# Patient Record
Sex: Female | Born: 1960 | Race: White | Hispanic: No | Marital: Married | Smoking: Former smoker
Health system: Southern US, Community
[De-identification: ages and names within clinical notes are randomized; demographics above are authoritative.]

## PROBLEM LIST (undated history)

## (undated) DIAGNOSIS — R519 Headache, unspecified: Secondary | ICD-10-CM

## (undated) DIAGNOSIS — R51 Headache: Secondary | ICD-10-CM

## (undated) DIAGNOSIS — F419 Anxiety disorder, unspecified: Secondary | ICD-10-CM

## (undated) DIAGNOSIS — I10 Essential (primary) hypertension: Secondary | ICD-10-CM

## (undated) DIAGNOSIS — D649 Anemia, unspecified: Secondary | ICD-10-CM

## (undated) DIAGNOSIS — I471 Supraventricular tachycardia, unspecified: Secondary | ICD-10-CM

## (undated) DIAGNOSIS — IMO0002 Reserved for concepts with insufficient information to code with codable children: Secondary | ICD-10-CM

## (undated) DIAGNOSIS — H9319 Tinnitus, unspecified ear: Secondary | ICD-10-CM

## (undated) DIAGNOSIS — J45909 Unspecified asthma, uncomplicated: Secondary | ICD-10-CM

## (undated) DIAGNOSIS — F32A Depression, unspecified: Secondary | ICD-10-CM

## (undated) HISTORY — PX: OTHER SURGICAL HISTORY: SHX169

## (undated) HISTORY — DX: Depression, unspecified: F32.A

## (undated) HISTORY — PX: KNEE SURGERY: SHX244

---

## 1998-07-30 ENCOUNTER — Other Ambulatory Visit: Admission: RE | Admit: 1998-07-30 | Discharge: 1998-07-30 | Payer: Self-pay | Admitting: Obstetrics & Gynecology

## 1998-09-03 ENCOUNTER — Ambulatory Visit (HOSPITAL_COMMUNITY): Admission: RE | Admit: 1998-09-03 | Discharge: 1998-09-03 | Payer: Self-pay | Admitting: Obstetrics & Gynecology

## 2000-03-04 ENCOUNTER — Other Ambulatory Visit: Admission: RE | Admit: 2000-03-04 | Discharge: 2000-03-04 | Payer: Self-pay | Admitting: Obstetrics & Gynecology

## 2000-04-22 ENCOUNTER — Emergency Department (HOSPITAL_COMMUNITY): Admission: EM | Admit: 2000-04-22 | Discharge: 2000-04-22 | Payer: Self-pay | Admitting: Emergency Medicine

## 2001-06-13 ENCOUNTER — Other Ambulatory Visit: Admission: RE | Admit: 2001-06-13 | Discharge: 2001-06-13 | Payer: Self-pay | Admitting: Obstetrics & Gynecology

## 2002-11-06 ENCOUNTER — Other Ambulatory Visit: Admission: RE | Admit: 2002-11-06 | Discharge: 2002-11-06 | Payer: Self-pay | Admitting: Obstetrics & Gynecology

## 2004-01-10 ENCOUNTER — Other Ambulatory Visit: Admission: RE | Admit: 2004-01-10 | Discharge: 2004-01-10 | Payer: Self-pay | Admitting: Obstetrics & Gynecology

## 2005-03-26 ENCOUNTER — Other Ambulatory Visit: Admission: RE | Admit: 2005-03-26 | Discharge: 2005-03-26 | Payer: Self-pay | Admitting: Obstetrics & Gynecology

## 2010-08-13 ENCOUNTER — Encounter: Admission: RE | Admit: 2010-08-13 | Discharge: 2010-08-13 | Payer: Self-pay | Admitting: Geriatric Medicine

## 2011-01-15 ENCOUNTER — Other Ambulatory Visit: Payer: Self-pay | Admitting: Obstetrics & Gynecology

## 2011-01-15 DIAGNOSIS — R928 Other abnormal and inconclusive findings on diagnostic imaging of breast: Secondary | ICD-10-CM

## 2011-01-21 ENCOUNTER — Ambulatory Visit
Admission: RE | Admit: 2011-01-21 | Discharge: 2011-01-21 | Disposition: A | Discharge status: Home / Self Care | Payer: PRIVATE HEALTH INSURANCE | Source: Ambulatory Visit | Attending: Obstetrics & Gynecology | Admitting: Obstetrics & Gynecology

## 2011-01-21 DIAGNOSIS — R928 Other abnormal and inconclusive findings on diagnostic imaging of breast: Secondary | ICD-10-CM

## 2011-11-14 ENCOUNTER — Ambulatory Visit (INDEPENDENT_AMBULATORY_CARE_PROVIDER_SITE_OTHER): Payer: PRIVATE HEALTH INSURANCE

## 2011-11-14 DIAGNOSIS — J111 Influenza due to unidentified influenza virus with other respiratory manifestations: Secondary | ICD-10-CM

## 2012-03-06 ENCOUNTER — Emergency Department (HOSPITAL_COMMUNITY): Payer: PRIVATE HEALTH INSURANCE

## 2012-03-06 ENCOUNTER — Encounter (HOSPITAL_COMMUNITY): Payer: Self-pay | Admitting: *Deleted

## 2012-03-06 ENCOUNTER — Emergency Department (HOSPITAL_COMMUNITY)
Admission: EM | Admit: 2012-03-06 | Discharge: 2012-03-06 | Disposition: A | Discharge status: Home / Self Care | Payer: PRIVATE HEALTH INSURANCE | Attending: Emergency Medicine | Admitting: Emergency Medicine

## 2012-03-06 DIAGNOSIS — R42 Dizziness and giddiness: Secondary | ICD-10-CM | POA: Insufficient documentation

## 2012-03-06 DIAGNOSIS — F411 Generalized anxiety disorder: Secondary | ICD-10-CM | POA: Insufficient documentation

## 2012-03-06 DIAGNOSIS — R002 Palpitations: Secondary | ICD-10-CM | POA: Insufficient documentation

## 2012-03-06 DIAGNOSIS — IMO0002 Reserved for concepts with insufficient information to code with codable children: Secondary | ICD-10-CM | POA: Insufficient documentation

## 2012-03-06 HISTORY — DX: Supraventricular tachycardia: I47.1

## 2012-03-06 HISTORY — DX: Supraventricular tachycardia, unspecified: I47.10

## 2012-03-06 HISTORY — DX: Reserved for concepts with insufficient information to code with codable children: IMO0002

## 2012-03-06 HISTORY — DX: Anxiety disorder, unspecified: F41.9

## 2012-03-06 LAB — COMPREHENSIVE METABOLIC PANEL
ALT: 21 U/L (ref 0–35)
Alkaline Phosphatase: 78 U/L (ref 39–117)
CO2: 30 mEq/L (ref 19–32)
Calcium: 10.5 mg/dL (ref 8.4–10.5)
GFR calc Af Amer: 83 mL/min — ABNORMAL LOW (ref >90)
GFR calc non Af Amer: 71 mL/min — ABNORMAL LOW (ref >90)
Glucose, Bld: 99 mg/dL (ref 70–99)
Sodium: 138 mEq/L (ref 135–145)
Total Bilirubin: 0.3 mg/dL (ref 0.3–1.2)

## 2012-03-06 LAB — DIFFERENTIAL
Eosinophils Relative: 2 % (ref 0–5)
Lymphocytes Relative: 26 % (ref 12–46)
Lymphs Abs: 1.9 10*3/uL (ref 0.7–4.0)

## 2012-03-06 LAB — POCT I-STAT, CHEM 8
BUN: 14 mg/dL (ref 6–23)
Calcium, Ion: 1.35 mmol/L — ABNORMAL HIGH (ref 1.12–1.32)
Chloride: 104 mEq/L (ref 96–112)
Glucose, Bld: 97 mg/dL (ref 70–99)

## 2012-03-06 LAB — CBC
HCT: 43.4 % (ref 36.0–46.0)
MCV: 83 fL (ref 78.0–100.0)
Platelets: 237 10*3/uL (ref 150–400)
RBC: 5.23 MIL/uL — ABNORMAL HIGH (ref 3.87–5.11)
WBC: 7.2 10*3/uL (ref 4.0–10.5)

## 2012-03-06 LAB — URINALYSIS, ROUTINE W REFLEX MICROSCOPIC
Bilirubin Urine: NEGATIVE
Ketones, ur: NEGATIVE mg/dL
Nitrite: NEGATIVE
Protein, ur: NEGATIVE mg/dL
Urobilinogen, UA: 0.2 mg/dL (ref 0.0–1.0)

## 2012-03-06 MED ORDER — LORAZEPAM 1 MG PO TABS
1.0000 mg | ORAL_TABLET | Freq: Once | ORAL | Status: AC
  Administered 2012-03-06: 1 mg via ORAL
  Filled 2012-03-06: qty 1

## 2012-03-06 NOTE — ED Notes (Signed)
[id number]> Expected date:<BR> Expected time: 5:05 PM<BR> Means of arrival:Ambulance<BR> Comments:<BR> M41 -- Anxiety

## 2012-03-06 NOTE — ED Notes (Signed)
Per EMS. Pt is from work and started to feel dizzy and anxious. Pt has history of anxiety. Pt reports recent stress with mother's death last week and husband recently in hospital. Pt did not take anxiety medication today. Pt report this anxiety attack is different from others as this one came on more suddenly and was more severe. Pt denies chest pain or pressure, but does report palpitations. EMS did 12-Lead with NS and occ PAC. Pt also has 20G IV to left AC.  Pt reports anxiety has improved en route

## 2012-03-06 NOTE — ED Provider Notes (Signed)
History     CSN: [id number]  Arrival date & time 03/06/12  1715   First MD Initiated Contact with Patient 03/06/12 1955      Chief Complaint  Patient presents with  . Anxiety  . Palpitations  . Dizziness    (Consider location/radiation/quality/duration/timing/severity/associated sxs/prior treatment) HPI  Patient at work at desk and have palpitations and dizzy and felt like she was going to pass out.  No pain.  Patient had headache after arrival but resolved after 30 minutes.  Patient with history of some palpitations and wore monitor in past.  Patient with recent death of mother and takes xanax prn but not regularly.    Past Medical History  Diagnosis Date  . Anxiety   . SVT (supraventricular tachycardia)   . Degenerative disc disease     Past Surgical History  Procedure Date  . Cesarean section   . Knee surgery     Family History  Problem Relation Age of Onset  . Heart failure Mother   . Diabetes Mother   . Hypertension Mother   . Diabetes Father     History  Substance Use Topics  . Smoking status: Former Games developer  . Smokeless tobacco: Never Used  . Alcohol Use: Yes     occasionally    OB History    Grav Para Term Preterm Abortions TAB SAB Ect Mult Living                  Review of Systems  All other systems reviewed and are negative.    Allergies  Erythromycin  Home Medications   Current Outpatient Rx  Name Route Sig Dispense Refill  . ALPRAZOLAM 0.25 MG PO TABS Oral Take 0.25 mg by mouth at bedtime as needed.    . ATENOLOL 25 MG PO TABS Oral Take 25 mg by mouth daily.    . OMEGA-3 FATTY ACIDS 1000 MG PO CAPS Oral Take 1 g by mouth daily.    Malachy Mood FOR HER 50+ PO Oral Take 1 tablet by mouth at bedtime.    . MELOXICAM 15 MG PO TABS Oral Take 7.5 mg by mouth 2 (two) times daily. Takes 1/2 tablet twice daily      BP 111/56  Pulse 76  Resp 13  SpO2 100%  Physical Exam  Nursing note and vitals reviewed. Constitutional: She appears  well-developed and well-nourished.  HENT:  Head: Normocephalic and atraumatic.  Eyes: Conjunctivae and EOM are normal. Pupils are equal, round, and reactive to light.  Neck: Normal range of motion. Neck supple.  Cardiovascular: Normal rate, regular rhythm, normal heart sounds and intact distal pulses.   Pulmonary/Chest: Effort normal and breath sounds normal.  Abdominal: Soft. Bowel sounds are normal.  Musculoskeletal: Normal range of motion.  Neurological: She is alert.  Skin: Skin is warm and dry.  Psychiatric: She has a normal mood and affect. Thought content normal.    ED Course  Procedures (including critical care time)  Labs Reviewed  CBC - Abnormal; Notable for the following:    RBC 5.23 (*)    All other components within normal limits  COMPREHENSIVE METABOLIC PANEL - Abnormal; Notable for the following:    GFR calc non Af Amer 71 (*)    GFR calc Af Amer 83 (*)    All other components within normal limits  DIFFERENTIAL  POCT I-STAT TROPONIN I  URINALYSIS, ROUTINE W REFLEX MICROSCOPIC   No results found.   No diagnosis found.   Dg Chest  2 View  03/06/2012  *RADIOLOGY REPORT*  Clinical Data: Anxiety, dizziness and heart palpitations.  CHEST - 2 VIEW  Comparison: No priors.  Findings: Lung volumes are normal.  No consolidative airspace disease.  No pleural effusions.  No pneumothorax.  No pulmonary nodule or mass noted.  Pulmonary vasculature and the cardiomediastinal silhouette are within normal limits.  IMPRESSION: 1. No radiographic evidence of acute cardiopulmonary disease.  Original Report Authenticated By: Florencia Reasons, M.D.   Date: 03/06/2012  Rate: 82  Rhythm: normal sinus rhythm  QRS Axis: normal  Intervals: normal  ST/T Wave abnormalities: normal  Conduction Disutrbances: none  Narrative Interpretation: unremarkable     MDM    Patient with normal hydration here with EKG and troponin. She describes an episode of palpitations which has since  resolved. He is advised to followup with her primary care Dr. for possible repeat cardiac monitoring in the ambulatory setting. She is continuing to take atenolol which he has for several years due to an episode of palpitations and presumed tachycardia in the past. She's continued to take these.      Hilario Quarry, MD 03/06/12 [phone]

## 2012-03-06 NOTE — Discharge Instructions (Signed)
Palpitations  A palpitation is the feeling that your heartbeat is irregular or is faster than normal. Although this is frightening, it usually is not serious. Palpitations may be caused by excesses of smoking, caffeine, or alcohol. They are also brought on by stress and anxiety. Sometimes, they are caused by heart disease. Unless otherwise noted, your caregiver did not find any signs of serious illness at this time. HOME CARE INSTRUCTIONS  To help prevent palpitations:  Drink decaffeinated coffee, tea, and soda pop. Avoid chocolate.   If you smoke or drink alcohol, quit or cut down as much as possible.   Reduce your stress or anxiety level. Biofeedback, yoga, or meditation will help you relax. Physical activity such as swimming, jogging, or walking also may be helpful.  SEEK MEDICAL CARE IF:   You continue to have a fast heartbeat.   Your palpitations occur more often.  SEEK IMMEDIATE MEDICAL CARE IF: You develop chest pain, shortness of breath, severe headache, dizziness, or fainting. Document Released: 10/29/2000 Document Revised: 10/21/2011 Document Reviewed: 12/29/2007 ExitCare Patient Information 2012 [employer]. 

## 2012-03-06 NOTE — ED Notes (Signed)
Patient reports that she was at work and a sudden onset of "feeling funny" and feeling like she was going to pass out. Patient reports that she has had anxiety before, but "not like this." patient states she had palpitations at the beginning, but not now.

## 2014-11-15 HISTORY — PX: WISDOM TOOTH EXTRACTION: SHX21

## 2015-04-07 ENCOUNTER — Other Ambulatory Visit: Payer: Self-pay | Admitting: Orthopedic Surgery

## 2015-04-07 DIAGNOSIS — M47816 Spondylosis without myelopathy or radiculopathy, lumbar region: Secondary | ICD-10-CM

## 2015-04-09 ENCOUNTER — Ambulatory Visit
Admission: RE | Admit: 2015-04-09 | Discharge: 2015-04-09 | Disposition: A | Discharge status: Home / Self Care | Payer: Self-pay | Source: Ambulatory Visit | Attending: Orthopedic Surgery | Admitting: Orthopedic Surgery

## 2015-04-09 ENCOUNTER — Other Ambulatory Visit: Payer: Self-pay | Admitting: Orthopedic Surgery

## 2015-04-09 ENCOUNTER — Ambulatory Visit
Admission: RE | Admit: 2015-04-09 | Discharge: 2015-04-09 | Disposition: A | Discharge status: Home / Self Care | Payer: PRIVATE HEALTH INSURANCE | Source: Ambulatory Visit | Attending: Orthopedic Surgery | Admitting: Orthopedic Surgery

## 2015-04-09 DIAGNOSIS — M47816 Spondylosis without myelopathy or radiculopathy, lumbar region: Secondary | ICD-10-CM

## 2015-04-09 DIAGNOSIS — R52 Pain, unspecified: Secondary | ICD-10-CM

## 2015-04-09 MED ORDER — IOHEXOL 180 MG/ML  SOLN
15.0000 mL | Freq: Once | INTRAMUSCULAR | Status: AC | PRN
  Administered 2015-04-09: 15 mL via INTRATHECAL

## 2015-04-09 MED ORDER — MEPERIDINE HCL 100 MG/ML IJ SOLN
75.0000 mg | Freq: Once | INTRAMUSCULAR | Status: AC
  Administered 2015-04-09: 75 mg via INTRAMUSCULAR

## 2015-04-09 MED ORDER — DIAZEPAM 5 MG PO TABS
5.0000 mg | ORAL_TABLET | Freq: Once | ORAL | Status: AC
  Administered 2015-04-09: 5 mg via ORAL

## 2015-04-09 MED ORDER — ONDANSETRON HCL 4 MG/2ML IJ SOLN
4.0000 mg | Freq: Once | INTRAMUSCULAR | Status: AC
  Administered 2015-04-09: 4 mg via INTRAMUSCULAR

## 2015-04-09 NOTE — Discharge Instructions (Addendum)
Myelogram Discharge Instructions  1. Go home and rest quietly for the next 24 hours.  It is important to lie flat for the next 24 hours.  Get up only to go to the restroom.  You may lie in the bed or on a couch on your back, your stomach, your left side or your right side.  You may have one pillow under your head.  You may have pillows between your knees while you are on your side or under your knees while you are on your back.  2. DO NOT drive today.  Recline the seat as far back as it will go, while still wearing your seat belt, on the way home.  3. You may get up to go to the bathroom as needed.  You may sit up for 10 minutes to eat.  You may resume your normal diet and medications unless otherwise indicated.  Drink lots of extra fluids today and tomorrow.  4. The incidence of headache, nausea, or vomiting is about 5% (one in 20 patients).  If you develop a headache, lie flat and drink plenty of fluids until the headache goes away.  Caffeinated beverages may be helpful.  If you develop severe nausea and vomiting or a headache that does not go away with flat bed rest, call [phone].  5. You may resume normal activities after your 24 hours of bed rest is over; however, do not exert yourself strongly or do any heavy lifting tomorrow. If when you get up you have a headache when standing, go back to bed and force fluids for another 24 hours.  6. Call your physician for a follow-up appointment.  The results of your myelogram will be sent directly to your physician by the following day.  7. If you have any questions or if complications develop after you arrive home, please call [phone].  Discharge instructions have been explained to the patient.  The patient, or the person responsible for the patient, fully understands these instructions.       May resume Sertraline on Apr 10, 2015, after 9:30 am.

## 2015-04-09 NOTE — Progress Notes (Signed)
Pt states she has been off Sertraline for the past 2 days. Discharge instructions explained to pt. 

## 2015-04-10 ENCOUNTER — Other Ambulatory Visit: Payer: Self-pay | Admitting: Orthopedic Surgery

## 2015-04-11 ENCOUNTER — Other Ambulatory Visit (HOSPITAL_COMMUNITY): Payer: Self-pay | Admitting: Orthopedic Surgery

## 2015-04-11 ENCOUNTER — Other Ambulatory Visit (HOSPITAL_COMMUNITY): Payer: Self-pay | Admitting: Anesthesiology

## 2015-04-11 NOTE — Patient Instructions (Addendum)
Laurie PlowmanBrenda A Mcclain  04/11/2015   Your procedure is scheduled on:  Wednesday 04/16/2015  Report [facility] Main  Entrance and follow signs to               Short Stay Center at 135 PM.  Call this number if you have problems the morning of surgery [phone]   Remember: ONLY 1 PERSON MAY GO WITH YOU TO SHORT STAY TO GET  READY MORNING OF YOUR SURGERY.   Do not eat food  :After Midnight. MAY HAVE CLEAR LIQUIDS FROM MIDNIGHT UNTIL 0935 AM THEN NOTHING UNTIL AFTER SURGERY!     Take these medicines the morning of surgery with A SIP OF WATER:  Alegra, Systane Eye Drops, Hydrocodone if needed                               You may not have any metal on your body including hair pins and              piercings  Do not wear jewelry, make-up, lotions, powders or perfumes, deodorant             Do not wear nail polish.  Do not shave  48 hours prior to surgery.           .   Do not bring valuables to the hospital. [facility] IS NOT             RESPONSIBLE   FOR VALUABLES.  Contacts, dentures or bridgework may not be worn into surgery.  Leave suitcase in the car. After surgery it may be brought to your room.      Special Instructions: coughing and deep breathing exercises, leg exercises              Please read over the following fact sheets you were given: _____________________________________________________________________             [facility] - Preparing for Surgery Before surgery, you can play an important role.  Because skin is not sterile, your skin needs to be as free of germs as possible.  You can reduce the number of germs on your skin by washing with CHG (chlorahexidine gluconate) soap before surgery.  CHG is an antiseptic cleaner which kills germs and bonds with the skin to continue killing germs even after washing. Please DO NOT use if you have an allergy to CHG or antibacterial soaps.  If your skin becomes reddened/irritated stop using the CHG and inform  your nurse when you arrive at Short Stay. Do not shave (including legs and underarms) for at least 48 hours prior to the first CHG shower.  You may shave your face/neck. Please follow these instructions carefully:  1.  Shower with CHG Soap the night before surgery and the  morning of Surgery.  2.  If you choose to wash your hair, wash your hair first as usual with your  normal  shampoo.  3.  After you shampoo, rinse your hair and body thoroughly to remove the  shampoo.                           4.  Use CHG as you would any other liquid soap.  You can apply chg directly  to the skin and wash  Gently with a scrungie or clean washcloth.  5.  Apply the CHG Soap to your body ONLY FROM THE NECK DOWN.   Do not use on face/ open                           Wound or open sores. Avoid contact with eyes, ears mouth and genitals (private parts).                       Wash face,  Genitals (private parts) with your normal soap.             6.  Wash thoroughly, paying special attention to the area where your surgery  will be performed.  7.  Thoroughly rinse your body with warm water from the neck down.  8.  DO NOT shower/wash with your normal soap after using and rinsing off  the CHG Soap.                9.  Pat yourself dry with a clean towel.            10.  Wear clean pajamas.            11.  Place clean sheets on your bed the night of your first shower and do not  sleep with pets. Day of Surgery : Do not apply any lotions/deodorants the morning of surgery.  Please wear clean clothes to the hospital/surgery center.  FAILURE TO FOLLOW THESE INSTRUCTIONS MAY RESULT IN THE CANCELLATION OF YOUR SURGERY PATIENT SIGNATURE_________________________________  NURSE SIGNATURE__________________________________  ________________________________________________________________________   Adam Phenix  An incentive spirometer is a tool that can help keep your lungs clear and active. This  tool measures how well you are filling your lungs with each breath. Taking long deep breaths may help reverse or decrease the chance of developing breathing (pulmonary) problems (especially infection) following:  A long period of time when you are unable to move or be active. BEFORE THE PROCEDURE   If the spirometer includes an indicator to show your best effort, your nurse or respiratory therapist will set it to a desired goal.  If possible, sit up straight or lean slightly forward. Try not to slouch.  Hold the incentive spirometer in an upright position. INSTRUCTIONS FOR USE  1. Sit on the edge of your bed if possible, or sit up as far as you can in bed or on a chair. 2. Hold the incentive spirometer in an upright position. 3. Breathe out normally. 4. Place the mouthpiece in your mouth and seal your lips tightly around it. 5. Breathe in slowly and as deeply as possible, raising the piston or the ball toward the top of the column. 6. Hold your breath for 3-5 seconds or for as long as possible. Allow the piston or ball to fall to the bottom of the column. 7. Remove the mouthpiece from your mouth and breathe out normally. 8. Rest for a few seconds and repeat Steps 1 through 7 at least 10 times every 1-2 hours when you are awake. Take your time and take a few normal breaths between deep breaths. 9. The spirometer may include an indicator to show your best effort. Use the indicator as a goal to work toward during each repetition. 10. After each set of 10 deep breaths, practice coughing to be sure your lungs are clear. If you have an incision (the cut made at the time of surgery),  support your incision when coughing by placing a pillow or rolled up towels firmly against it. Once you are able to get out of bed, walk around indoors and cough well. You may stop using the incentive spirometer when instructed by your caregiver.  RISKS AND COMPLICATIONS  Take your time so you do not get dizzy or  light-headed.  If you are in pain, you may need to take or ask for pain medication before doing incentive spirometry. It is harder to take a deep breath if you are having pain. AFTER USE  Rest and breathe slowly and easily.  It can be helpful to keep track of a log of your progress. Your caregiver can provide you with a simple table to help with this. If you are using the spirometer at home, follow these instructions: [place] IF:   You are having difficultly using the spirometer.  You have trouble using the spirometer as often as instructed.  Your pain medication is not giving enough relief while using the spirometer.  You develop fever of 100.5 F (38.1 C) or higher. SEEK IMMEDIATE MEDICAL CARE IF:   You cough up bloody sputum that had not been present before.  You develop fever of 102 F (38.9 C) or greater.  You develop worsening pain at or near the incision site. MAKE SURE YOU:   Understand these instructions.  Will watch your condition.  Will get help right away if you are not doing well or get worse. Document Released: 03/14/2007 Document Revised: 01/24/2012 Document Reviewed: 05/15/2007 ExitCare Patient Information 2014 [place].   ________________________________________________________________________    CLEAR LIQUID DIET   Foods Allowed                                                                     Foods Excluded  Coffee and tea, regular and decaf                             liquids that you cannot  Plain Jell-O in any flavor                                             see through such as: Fruit ices (not with fruit pulp)                                     milk, soups, orange juice  Iced Popsicles                                    All solid food Carbonated beverages, regular and diet                                    Cranberry, grape and apple juices Sports drinks like Gatorade Lightly seasoned clear broth or consume(fat  free) Sugar, honey syrup  Sample Menu Breakfast  Lunch                                     Supper Cranberry juice                    Beef broth                            Chicken broth Jell-O                                     Grape juice                           Apple juice Coffee or tea                        Jell-O                                      Popsicle                                                Coffee or tea                        Coffee or tea  _____________________________________________________________________

## 2015-04-15 ENCOUNTER — Encounter (HOSPITAL_COMMUNITY): Payer: Self-pay

## 2015-04-15 ENCOUNTER — Ambulatory Visit (HOSPITAL_COMMUNITY)
Admission: RE | Admit: 2015-04-15 | Discharge: 2015-04-15 | Disposition: A | Discharge status: Home / Self Care | Payer: PRIVATE HEALTH INSURANCE | Source: Ambulatory Visit | Attending: Orthopedic Surgery | Admitting: Orthopedic Surgery

## 2015-04-15 ENCOUNTER — Encounter (HOSPITAL_COMMUNITY)
Admission: RE | Admit: 2015-04-15 | Discharge: 2015-04-15 | Disposition: A | Discharge status: Home / Self Care | Payer: PRIVATE HEALTH INSURANCE | Source: Ambulatory Visit | Attending: Orthopedic Surgery | Admitting: Orthopedic Surgery

## 2015-04-15 DIAGNOSIS — Z01818 Encounter for other preprocedural examination: Secondary | ICD-10-CM | POA: Insufficient documentation

## 2015-04-15 DIAGNOSIS — Z01810 Encounter for preprocedural cardiovascular examination: Secondary | ICD-10-CM | POA: Insufficient documentation

## 2015-04-15 DIAGNOSIS — M5126 Other intervertebral disc displacement, lumbar region: Secondary | ICD-10-CM | POA: Insufficient documentation

## 2015-04-15 DIAGNOSIS — M48062 Spinal stenosis, lumbar region with neurogenic claudication: Secondary | ICD-10-CM

## 2015-04-15 HISTORY — DX: Essential (primary) hypertension: I10

## 2015-04-15 HISTORY — DX: Tinnitus, unspecified ear: H93.19

## 2015-04-15 HISTORY — DX: Unspecified asthma, uncomplicated: J45.909

## 2015-04-15 HISTORY — DX: Headache: R51

## 2015-04-15 HISTORY — DX: Headache, unspecified: R51.9

## 2015-04-15 HISTORY — DX: Anemia, unspecified: D64.9

## 2015-04-15 LAB — CBC WITH DIFFERENTIAL/PLATELET
BASOS PCT: 1 % (ref 0–1)
Basophils Absolute: 0 10*3/uL (ref 0.0–0.1)
EOS ABS: 0.1 10*3/uL (ref 0.0–0.7)
EOS PCT: 3 % (ref 0–5)
HCT: 44.1 % (ref 36.0–46.0)
Hemoglobin: 14.4 g/dL (ref 12.0–15.0)
LYMPHS ABS: 1.6 10*3/uL (ref 0.7–4.0)
Lymphocytes Relative: 36 % (ref 12–46)
MCH: 28.1 pg (ref 26.0–34.0)
MCHC: 32.7 g/dL (ref 30.0–36.0)
MCV: 86 fL (ref 78.0–100.0)
Monocytes Absolute: 0.4 10*3/uL (ref 0.1–1.0)
Monocytes Relative: 10 % (ref 3–12)
Neutro Abs: 2.2 10*3/uL (ref 1.7–7.7)
Neutrophils Relative %: 50 % (ref 43–77)
Platelets: 220 10*3/uL (ref 150–400)
RBC: 5.13 MIL/uL — ABNORMAL HIGH (ref 3.87–5.11)
RDW: 13.5 % (ref 11.5–15.5)
WBC: 4.4 10*3/uL (ref 4.0–10.5)

## 2015-04-15 LAB — URINALYSIS, ROUTINE W REFLEX MICROSCOPIC
Bilirubin Urine: NEGATIVE
GLUCOSE, UA: NEGATIVE mg/dL
HGB URINE DIPSTICK: NEGATIVE
KETONES UR: NEGATIVE mg/dL
Leukocytes, UA: NEGATIVE
NITRITE: NEGATIVE
PROTEIN: NEGATIVE mg/dL
Specific Gravity, Urine: 1.003 — ABNORMAL LOW (ref 1.005–1.030)
UROBILINOGEN UA: 0.2 mg/dL (ref 0.0–1.0)
pH: 7 (ref 5.0–8.0)

## 2015-04-15 LAB — COMPREHENSIVE METABOLIC PANEL
ALT: 32 U/L (ref 14–54)
ANION GAP: 7 (ref 5–15)
AST: 35 U/L (ref 15–41)
Albumin: 4.3 g/dL (ref 3.5–5.0)
Alkaline Phosphatase: 54 U/L (ref 38–126)
BILIRUBIN TOTAL: 0.6 mg/dL (ref 0.3–1.2)
BUN: 18 mg/dL (ref 6–20)
CHLORIDE: 101 mmol/L (ref 101–111)
CO2: 31 mmol/L (ref 22–32)
Calcium: 10 mg/dL (ref 8.9–10.3)
Creatinine, Ser: 0.88 mg/dL (ref 0.44–1.00)
GFR calc Af Amer: >60 mL/min (ref >60)
GFR calc non Af Amer: >60 mL/min (ref >60)
GLUCOSE: 107 mg/dL — AB (ref 65–99)
Potassium: 4.6 mmol/L (ref 3.5–5.1)
Sodium: 139 mmol/L (ref 135–145)
Total Protein: 7.3 g/dL (ref 6.5–8.1)

## 2015-04-15 LAB — PROTIME-INR
INR: 0.91 (ref 0.00–1.49)
Prothrombin Time: 12.5 seconds (ref 11.6–15.2)

## 2015-04-15 LAB — SURGICAL PCR SCREEN
MRSA, PCR: NEGATIVE
STAPHYLOCOCCUS AUREUS: NEGATIVE

## 2015-04-16 ENCOUNTER — Observation Stay (HOSPITAL_COMMUNITY)
Admission: RE | Admit: 2015-04-16 | Discharge: 2015-04-17 | Disposition: A | Discharge status: Home / Self Care | Payer: PRIVATE HEALTH INSURANCE | Source: Ambulatory Visit | Attending: Orthopedic Surgery | Admitting: Orthopedic Surgery

## 2015-04-16 ENCOUNTER — Ambulatory Visit (HOSPITAL_COMMUNITY): Payer: PRIVATE HEALTH INSURANCE | Admitting: Anesthesiology

## 2015-04-16 ENCOUNTER — Encounter (HOSPITAL_COMMUNITY): Payer: Self-pay | Admitting: *Deleted

## 2015-04-16 ENCOUNTER — Ambulatory Visit (HOSPITAL_COMMUNITY): Payer: PRIVATE HEALTH INSURANCE

## 2015-04-16 ENCOUNTER — Encounter (HOSPITAL_COMMUNITY)
Admission: RE | Disposition: A | Discharge status: Home / Self Care | Payer: Self-pay | Source: Ambulatory Visit | Attending: Orthopedic Surgery

## 2015-04-16 DIAGNOSIS — Z87891 Personal history of nicotine dependence: Secondary | ICD-10-CM | POA: Insufficient documentation

## 2015-04-16 DIAGNOSIS — F419 Anxiety disorder, unspecified: Secondary | ICD-10-CM | POA: Insufficient documentation

## 2015-04-16 DIAGNOSIS — D649 Anemia, unspecified: Secondary | ICD-10-CM | POA: Insufficient documentation

## 2015-04-16 DIAGNOSIS — M48062 Spinal stenosis, lumbar region with neurogenic claudication: Secondary | ICD-10-CM | POA: Diagnosis present

## 2015-04-16 DIAGNOSIS — Z881 Allergy status to other antibiotic agents status: Secondary | ICD-10-CM | POA: Insufficient documentation

## 2015-04-16 DIAGNOSIS — M5127 Other intervertebral disc displacement, lumbosacral region: Principal | ICD-10-CM | POA: Insufficient documentation

## 2015-04-16 DIAGNOSIS — M21379 Foot drop, unspecified foot: Secondary | ICD-10-CM | POA: Diagnosis not present

## 2015-04-16 DIAGNOSIS — I10 Essential (primary) hypertension: Secondary | ICD-10-CM | POA: Diagnosis not present

## 2015-04-16 DIAGNOSIS — J45909 Unspecified asthma, uncomplicated: Secondary | ICD-10-CM | POA: Diagnosis not present

## 2015-04-16 DIAGNOSIS — M5126 Other intervertebral disc displacement, lumbar region: Secondary | ICD-10-CM | POA: Diagnosis present

## 2015-04-16 HISTORY — PX: LUMBAR LAMINECTOMY/DECOMPRESSION MICRODISCECTOMY: SHX5026

## 2015-04-16 SURGERY — LUMBAR LAMINECTOMY/DECOMPRESSION MICRODISCECTOMY 2 LEVELS
Anesthesia: General | Laterality: Left

## 2015-04-16 MED ORDER — DEXAMETHASONE SODIUM PHOSPHATE 10 MG/ML IJ SOLN
INTRAMUSCULAR | Status: AC
  Filled 2015-04-16: qty 1

## 2015-04-16 MED ORDER — DEXAMETHASONE SODIUM PHOSPHATE 10 MG/ML IJ SOLN
INTRAMUSCULAR | Status: DC | PRN
  Administered 2015-04-16: 10 mg via INTRAVENOUS

## 2015-04-16 MED ORDER — HYDROMORPHONE HCL 1 MG/ML IJ SOLN
0.2500 mg | INTRAMUSCULAR | Status: DC | PRN
  Administered 2015-04-16 (×2): 0.5 mg via INTRAVENOUS

## 2015-04-16 MED ORDER — HYDROMORPHONE HCL 1 MG/ML IJ SOLN: 0.5000 mg | INTRAMUSCULAR | Status: DC | PRN

## 2015-04-16 MED ORDER — LACTATED RINGERS IV SOLN
INTRAVENOUS | Status: DC
  Administered 2015-04-17: via INTRAVENOUS

## 2015-04-16 MED ORDER — FENTANYL CITRATE (PF) 100 MCG/2ML IJ SOLN
INTRAMUSCULAR | Status: AC
  Filled 2015-04-16: qty 2

## 2015-04-16 MED ORDER — ROCURONIUM BROMIDE 100 MG/10ML IV SOLN
INTRAVENOUS | Status: DC | PRN
  Administered 2015-04-16 (×2): 10 mg via INTRAVENOUS
  Administered 2015-04-16: 40 mg via INTRAVENOUS

## 2015-04-16 MED ORDER — THROMBIN 5000 UNITS EX SOLR
CUTANEOUS | Status: DC | PRN
  Administered 2015-04-16: 10000 [IU] via TOPICAL

## 2015-04-16 MED ORDER — BACITRACIN-NEOMYCIN-POLYMYXIN 400-5-5000 EX OINT
TOPICAL_OINTMENT | CUTANEOUS | Status: AC
  Filled 2015-04-16: qty 1

## 2015-04-16 MED ORDER — HYDROMORPHONE HCL 1 MG/ML IJ SOLN
INTRAMUSCULAR | Status: AC
  Filled 2015-04-16: qty 1

## 2015-04-16 MED ORDER — CEFAZOLIN SODIUM 1-5 GM-% IV SOLN
1.0000 g | Freq: Three times a day (TID) | INTRAVENOUS | Status: AC
  Administered 2015-04-16 – 2015-04-17 (×3): 1 g via INTRAVENOUS
  Filled 2015-04-16 (×3): qty 50

## 2015-04-16 MED ORDER — HYDROCODONE-ACETAMINOPHEN 5-325 MG PO TABS
1.0000 | ORAL_TABLET | ORAL | Status: DC | PRN
  Administered 2015-04-16 – 2015-04-17 (×3): 1 via ORAL
  Administered 2015-04-17: 2 via ORAL
  Administered 2015-04-17: 1 via ORAL
  Filled 2015-04-16 (×3): qty 1
  Filled 2015-04-16: qty 2
  Filled 2015-04-16: qty 1

## 2015-04-16 MED ORDER — SODIUM CHLORIDE 0.9 % IR SOLN
Status: AC
  Filled 2015-04-16: qty 1

## 2015-04-16 MED ORDER — BUPIVACAINE-EPINEPHRINE 0.5% -1:200000 IJ SOLN
INTRAMUSCULAR | Status: DC | PRN
  Administered 2015-04-16: 20 mL

## 2015-04-16 MED ORDER — PROPOFOL 10 MG/ML IV BOLUS
INTRAVENOUS | Status: DC | PRN
  Administered 2015-04-16: 150 mg via INTRAVENOUS

## 2015-04-16 MED ORDER — ATENOLOL 25 MG PO TABS
25.0000 mg | ORAL_TABLET | Freq: Every evening | ORAL | Status: DC
  Filled 2015-04-16 (×2): qty 1

## 2015-04-16 MED ORDER — ACETAMINOPHEN 650 MG RE SUPP: 650.0000 mg | RECTAL | Status: DC | PRN

## 2015-04-16 MED ORDER — FLEET ENEMA 7-19 GM/118ML RE ENEM: 1.0000 | ENEMA | Freq: Once | RECTAL | Status: AC | PRN

## 2015-04-16 MED ORDER — LIDOCAINE HCL (CARDIAC) 20 MG/ML IV SOLN
INTRAVENOUS | Status: AC
  Filled 2015-04-16: qty 5

## 2015-04-16 MED ORDER — PROPOFOL 10 MG/ML IV BOLUS
INTRAVENOUS | Status: AC
  Filled 2015-04-16: qty 20

## 2015-04-16 MED ORDER — NALOXEGOL OXALATE 25 MG PO TABS
25.0000 mg | ORAL_TABLET | Freq: Every day | ORAL | Status: DC
  Filled 2015-04-16 (×2): qty 1

## 2015-04-16 MED ORDER — THROMBIN 5000 UNITS EX SOLR
CUTANEOUS | Status: AC
  Filled 2015-04-16: qty 10000

## 2015-04-16 MED ORDER — METHOCARBAMOL 500 MG PO TABS: 500.0000 mg | ORAL_TABLET | Freq: Three times a day (TID) | ORAL | Status: DC | PRN

## 2015-04-16 MED ORDER — PHENOL 1.4 % MT LIQD
1.0000 | OROMUCOSAL | Status: DC | PRN
  Filled 2015-04-16: qty 177

## 2015-04-16 MED ORDER — ALPRAZOLAM 0.25 MG PO TABS: 0.2500 mg | ORAL_TABLET | Freq: Every evening | ORAL | Status: DC | PRN

## 2015-04-16 MED ORDER — MIDAZOLAM HCL 5 MG/5ML IJ SOLN
INTRAMUSCULAR | Status: DC | PRN
  Administered 2015-04-16: 2 mg via INTRAVENOUS

## 2015-04-16 MED ORDER — EPHEDRINE SULFATE 50 MG/ML IJ SOLN
INTRAMUSCULAR | Status: AC
  Filled 2015-04-16: qty 1

## 2015-04-16 MED ORDER — PROMETHAZINE HCL 25 MG/ML IJ SOLN: 6.2500 mg | INTRAMUSCULAR | Status: DC | PRN

## 2015-04-16 MED ORDER — BUPIVACAINE-EPINEPHRINE (PF) 0.5% -1:200000 IJ SOLN
INTRAMUSCULAR | Status: AC
  Filled 2015-04-16: qty 30

## 2015-04-16 MED ORDER — OXYCODONE-ACETAMINOPHEN 5-325 MG PO TABS: 1.0000 | ORAL_TABLET | ORAL | Status: DC | PRN

## 2015-04-16 MED ORDER — LACTATED RINGERS IV SOLN
INTRAVENOUS | Status: DC
  Administered 2015-04-16: 1000 mL via INTRAVENOUS

## 2015-04-16 MED ORDER — METHOCARBAMOL 500 MG PO TABS
500.0000 mg | ORAL_TABLET | Freq: Four times a day (QID) | ORAL | Status: DC | PRN
Start: 2015-04-16 — End: 2015-04-17
  Administered 2015-04-17: 500 mg via ORAL
  Filled 2015-04-16 (×2): qty 1

## 2015-04-16 MED ORDER — FENTANYL CITRATE (PF) 100 MCG/2ML IJ SOLN
INTRAMUSCULAR | Status: DC | PRN
  Administered 2015-04-16 (×2): 50 ug via INTRAVENOUS
  Administered 2015-04-16: 100 ug via INTRAVENOUS

## 2015-04-16 MED ORDER — DEXTROSE 5 % IV SOLN
500.0000 mg | Freq: Four times a day (QID) | INTRAVENOUS | Status: DC | PRN
  Administered 2015-04-16: 500 mg via INTRAVENOUS
  Filled 2015-04-16 (×2): qty 5

## 2015-04-16 MED ORDER — CEFAZOLIN SODIUM-DEXTROSE 2-3 GM-% IV SOLR
2.0000 g | INTRAVENOUS | Status: AC
  Administered 2015-04-16: 2 g via INTRAVENOUS

## 2015-04-16 MED ORDER — CEFAZOLIN SODIUM-DEXTROSE 2-3 GM-% IV SOLR
INTRAVENOUS | Status: AC
  Filled 2015-04-16: qty 50

## 2015-04-16 MED ORDER — ACETAMINOPHEN 325 MG PO TABS: 650.0000 mg | ORAL_TABLET | ORAL | Status: DC | PRN

## 2015-04-16 MED ORDER — MIDAZOLAM HCL 2 MG/2ML IJ SOLN
INTRAMUSCULAR | Status: AC
  Filled 2015-04-16: qty 2

## 2015-04-16 MED ORDER — ONDANSETRON HCL 4 MG PO TABS: 4.0000 mg | ORAL_TABLET | Freq: Four times a day (QID) | ORAL | Status: DC | PRN

## 2015-04-16 MED ORDER — EPHEDRINE SULFATE 50 MG/ML IJ SOLN
INTRAMUSCULAR | Status: DC | PRN
  Administered 2015-04-16 (×2): 10 mg via INTRAVENOUS

## 2015-04-16 MED ORDER — ONDANSETRON HCL 4 MG/2ML IJ SOLN
INTRAMUSCULAR | Status: AC
  Filled 2015-04-16: qty 2

## 2015-04-16 MED ORDER — MENTHOL 3 MG MT LOZG: 1.0000 | LOZENGE | OROMUCOSAL | Status: DC | PRN

## 2015-04-16 MED ORDER — NEOSTIGMINE METHYLSULFATE 10 MG/10ML IV SOLN
INTRAVENOUS | Status: DC | PRN
  Administered 2015-04-16: 3 mg via INTRAVENOUS

## 2015-04-16 MED ORDER — BUPIVACAINE LIPOSOME 1.3 % IJ SUSP
20.0000 mL | Freq: Once | INTRAMUSCULAR | Status: AC
  Administered 2015-04-16: 20 mL
  Filled 2015-04-16: qty 20

## 2015-04-16 MED ORDER — GLYCOPYRROLATE 0.2 MG/ML IJ SOLN
INTRAMUSCULAR | Status: DC | PRN
Start: 2015-04-16 — End: 2015-04-16
  Administered 2015-04-16: 0.4 mg via INTRAVENOUS

## 2015-04-16 MED ORDER — POLYVINYL ALCOHOL 1.4 % OP SOLN
1.0000 [drp] | Freq: Every day | OPHTHALMIC | Status: DC | PRN
  Filled 2015-04-16: qty 15

## 2015-04-16 MED ORDER — ROCURONIUM BROMIDE 100 MG/10ML IV SOLN
INTRAVENOUS | Status: AC
  Filled 2015-04-16: qty 1

## 2015-04-16 MED ORDER — ONDANSETRON HCL 4 MG/2ML IJ SOLN: 4.0000 mg | INTRAMUSCULAR | Status: DC | PRN

## 2015-04-16 MED ORDER — POLYMYXIN B SULFATE 500000 UNITS IJ SOLR
INTRAMUSCULAR | Status: DC | PRN
  Administered 2015-04-16: 500 mL

## 2015-04-16 MED ORDER — SODIUM CHLORIDE 0.9 % IJ SOLN
INTRAMUSCULAR | Status: AC
  Filled 2015-04-16: qty 10

## 2015-04-16 MED ORDER — POLYETHYLENE GLYCOL 3350 17 G PO PACK: 17.0000 g | PACK | Freq: Every day | ORAL | Status: DC | PRN

## 2015-04-16 MED ORDER — LIDOCAINE HCL (PF) 2 % IJ SOLN
INTRAMUSCULAR | Status: DC | PRN
  Administered 2015-04-16: 20 mg via INTRADERMAL

## 2015-04-16 MED ORDER — ONDANSETRON HCL 4 MG/2ML IJ SOLN
INTRAMUSCULAR | Status: DC | PRN
  Administered 2015-04-16: 4 mg via INTRAVENOUS

## 2015-04-16 MED ORDER — SERTRALINE HCL 50 MG PO TABS
50.0000 mg | ORAL_TABLET | Freq: Every day | ORAL | Status: DC
  Administered 2015-04-16: 50 mg via ORAL
  Filled 2015-04-16 (×2): qty 1

## 2015-04-16 MED ORDER — POVIDONE-IODINE 7.5 % EX SOLN: Freq: Once | CUTANEOUS | Status: DC

## 2015-04-16 MED ORDER — BISACODYL 5 MG PO TBEC
5.0000 mg | DELAYED_RELEASE_TABLET | Freq: Every day | ORAL | Status: DC | PRN
  Administered 2015-04-17: 5 mg via ORAL
  Filled 2015-04-16: qty 1

## 2015-04-16 SURGICAL SUPPLY — 48 items
APL SKNCLS STERI-STRIP NONHPOA (GAUZE/BANDAGES/DRESSINGS) ×1
BAG SPEC THK2 15X12 ZIP CLS (MISCELLANEOUS) ×1
BAG ZIPLOCK 12X15 (MISCELLANEOUS) ×2 IMPLANT
BENZOIN TINCTURE PRP APPL 2/3 (GAUZE/BANDAGES/DRESSINGS) ×2 IMPLANT
CLEANER TIP ELECTROSURG 2X2 (MISCELLANEOUS) ×2 IMPLANT
DRAIN PENROSE 18X1/4 LTX STRL (WOUND CARE) IMPLANT
DRAPE MICROSCOPE LEICA (MISCELLANEOUS) ×2 IMPLANT
DRAPE POUCH INSTRU U-SHP 10X18 (DRAPES) ×2 IMPLANT
DRAPE SHEET LG 3/4 BI-LAMINATE (DRAPES) ×2 IMPLANT
DRAPE SURG 17X11 SM STRL (DRAPES) ×2 IMPLANT
DRSG ADAPTIC 3X8 NADH LF (GAUZE/BANDAGES/DRESSINGS) ×2 IMPLANT
DRSG PAD ABDOMINAL 8X10 ST (GAUZE/BANDAGES/DRESSINGS) ×4 IMPLANT
DURAPREP 26ML APPLICATOR (WOUND CARE) ×2 IMPLANT
ELECT REM PT RETURN 9FT ADLT (ELECTROSURGICAL) ×2
ELECTRODE REM PT RTRN 9FT ADLT (ELECTROSURGICAL) ×1 IMPLANT
GAUZE SPONGE 4X4 12PLY STRL (GAUZE/BANDAGES/DRESSINGS) ×1 IMPLANT
GLOVE BIOGEL PI IND STRL 8 (GLOVE) ×1 IMPLANT
GLOVE BIOGEL PI INDICATOR 8 (GLOVE) ×1
GLOVE ECLIPSE 8.0 STRL XLNG CF (GLOVE) ×6 IMPLANT
GLOVE INDICATOR 8.0 STRL GRN (GLOVE) ×2 IMPLANT
GOWN STRL REUS W/TWL XL LVL3 (GOWN DISPOSABLE) ×4 IMPLANT
KIT BASIN OR (CUSTOM PROCEDURE TRAY) ×2 IMPLANT
KIT POSITIONING SURG ANDREWS (MISCELLANEOUS) ×2 IMPLANT
MANIFOLD NEPTUNE II (INSTRUMENTS) ×2 IMPLANT
NDL SPNL 18GX3.5 QUINCKE PK (NEEDLE) ×2 IMPLANT
NEEDLE HYPO 22GX1.5 SAFETY (NEEDLE) ×4 IMPLANT
NEEDLE SPNL 18GX3.5 QUINCKE PK (NEEDLE) ×4 IMPLANT
NS IRRIG 1000ML POUR BTL (IV SOLUTION) IMPLANT
PACK LAMINECTOMY ORTHO (CUSTOM PROCEDURE TRAY) ×2 IMPLANT
PATTIES SURGICAL .5 X.5 (GAUZE/BANDAGES/DRESSINGS) IMPLANT
PATTIES SURGICAL .75X.75 (GAUZE/BANDAGES/DRESSINGS) IMPLANT
PATTIES SURGICAL 1X1 (DISPOSABLE) IMPLANT
PEN SKIN MARKING BROAD (MISCELLANEOUS) ×2 IMPLANT
PIN SAFETY NICK PLATE  2 MED (MISCELLANEOUS)
PIN SAFETY NICK PLATE 2 MED (MISCELLANEOUS) IMPLANT
POSITIONER SURGICAL ARM (MISCELLANEOUS) ×2 IMPLANT
SPONGE LAP 4X18 X RAY DECT (DISPOSABLE) ×4 IMPLANT
SPONGE SURGIFOAM ABS GEL 100 (HEMOSTASIS) ×2 IMPLANT
STAPLER VISISTAT 35W (STAPLE) ×2 IMPLANT
SUT VIC AB 0 CT1 27 (SUTURE)
SUT VIC AB 0 CT1 27XBRD ANTBC (SUTURE) IMPLANT
SUT VIC AB 1 CT1 27 (SUTURE) ×8
SUT VIC AB 1 CT1 27XBRD ANTBC (SUTURE) ×4 IMPLANT
SYR 20CC LL (SYRINGE) ×4 IMPLANT
TAPE CLOTH SURG 4X10 WHT LF (GAUZE/BANDAGES/DRESSINGS) ×1 IMPLANT
TOWEL OR 17X26 10 PK STRL BLUE (TOWEL DISPOSABLE) ×2 IMPLANT
TOWEL OR NON WOVEN STRL DISP B (DISPOSABLE) IMPLANT
TRAY FOLEY W/METER SILVER 14FR (SET/KITS/TRAYS/PACK) ×2 IMPLANT

## 2015-04-16 NOTE — H&P (Signed)
Laurie Mcclain is an 54 y.o. female.   Chief Complaint: Pain in both Legs and weakness in her Right Foot Dorsiflexors HPI: She sneezed and developed sever pain in her Right Leg and Weakness in her Right Foot.  Past Medical History  Diagnosis Date  . Anxiety   . SVT (supraventricular tachycardia)   . Degenerative disc disease   . Hypertension   . Asthma     hx as child  . Headache     sinus headaches  . Anemia     hx with pregnancy  . Tinnitus     Past Surgical History  Procedure Laterality Date  . Cesarean section    . Knee surgery    . Laser surgery on left retina      also had preventive laser eye surgery on left eye  . Wisdom tooth extraction  2016    Family History  Problem Relation Age of Onset  . Heart failure Mother   . Diabetes Mother   . Hypertension Mother   . Diabetes Father    Social History:  reports that she has quit smoking. She has never used smokeless tobacco. She reports that she drinks alcohol. She reports that she does not use illicit drugs.  Allergies:  Allergies  Allergen Reactions  . Erythromycin     unknown  . Other     Anti toxin tetanus    Medications Prior to Admission  Medication Sig Dispense Refill  . atenolol (TENORMIN) 25 MG tablet Take 25 mg by mouth every evening.     . fexofenadine (ALLEGRA) 180 MG tablet Take 180 mg by mouth daily.    . fish oil-omega-3 fatty acids 1000 MG capsule Take 1 g by mouth at bedtime.     Marland Kitchen HYDROcodone-acetaminophen (NORCO/VICODIN) 5-325 MG per tablet Take 1-2 tablets by mouth every 4 (four) hours as needed for moderate pain.    . indomethacin (INDOCIN) 25 MG capsule Take 1 capsule by mouth 2 (two) times daily.  2  . methocarbamol (ROBAXIN) 500 MG tablet Take 500 mg by mouth every 8 (eight) hours as needed for muscle spasms.   1  . Multiple Vitamins-Minerals (MULTI FOR HER 50+ PO) Take 1 tablet by mouth at bedtime.    . ondansetron (ZOFRAN) 4 MG tablet Take 4 mg by mouth every 6 (six) hours as needed  for nausea or vomiting.    Vladimir Faster Glycol-Propyl Glycol (SYSTANE OP) Apply 1-2 drops to eye daily as needed (dry eyes.).    Marland Kitchen sertraline (ZOLOFT) 100 MG tablet Take 0.5 tablets by mouth daily.   2  . ALPRAZolam (XANAX) 0.25 MG tablet Take 0.25 mg by mouth at bedtime as needed for anxiety or sleep.       Results for orders placed or performed during the hospital encounter of 04/15/15 (from the past 48 hour(s))  Urinalysis, Routine w reflex microscopic     Status: Abnormal   Collection Time: 04/15/15 11:00 AM  Result Value Ref Range   Color, Urine YELLOW YELLOW   APPearance CLEAR CLEAR   Specific Gravity, Urine 1.003 (L) 1.005 - 1.030   pH 7.0 5.0 - 8.0   Glucose, UA NEGATIVE NEGATIVE mg/dL   Hgb urine dipstick NEGATIVE NEGATIVE   Bilirubin Urine NEGATIVE NEGATIVE   Ketones, ur NEGATIVE NEGATIVE mg/dL   Protein, ur NEGATIVE NEGATIVE mg/dL   Urobilinogen, UA 0.2 0.0 - 1.0 mg/dL   Nitrite NEGATIVE NEGATIVE   Leukocytes, UA NEGATIVE NEGATIVE    Comment: MICROSCOPIC  NOT DONE ON URINES WITH NEGATIVE PROTEIN, BLOOD, LEUKOCYTES, NITRITE, OR GLUCOSE <1000 mg/dL.  Surgical pcr screen     Status: None   Collection Time: 04/15/15 11:40 AM  Result Value Ref Range   MRSA, PCR NEGATIVE NEGATIVE   Staphylococcus aureus NEGATIVE NEGATIVE    Comment:        The Xpert SA Assay (FDA approved for NASAL specimens in patients over 9 years of age), is one component of a comprehensive surveillance program.  Test performance has been validated by [facility] for patients greater than or equal to 17 year old. It is not intended to diagnose infection nor to guide or monitor treatment.   CBC WITH DIFFERENTIAL     Status: Abnormal   Collection Time: 04/15/15 11:50 AM  Result Value Ref Range   WBC 4.4 4.0 - 10.5 K/uL   RBC 5.13 (H) 3.87 - 5.11 MIL/uL   Hemoglobin 14.4 12.0 - 15.0 g/dL   HCT 44.1 36.0 - 46.0 %   MCV 86.0 78.0 - 100.0 fL   MCH 28.1 26.0 - 34.0 pg   MCHC 32.7 30.0 - 36.0 g/dL    RDW 13.5 11.5 - 15.5 %   Platelets 220 150 - 400 K/uL   Neutrophils Relative % 50 43 - 77 %   Neutro Abs 2.2 1.7 - 7.7 K/uL   Lymphocytes Relative 36 12 - 46 %   Lymphs Abs 1.6 0.7 - 4.0 K/uL   Monocytes Relative 10 3 - 12 %   Monocytes Absolute 0.4 0.1 - 1.0 K/uL   Eosinophils Relative 3 0 - 5 %   Eosinophils Absolute 0.1 0.0 - 0.7 K/uL   Basophils Relative 1 0 - 1 %   Basophils Absolute 0.0 0.0 - 0.1 K/uL  Comprehensive metabolic panel     Status: Abnormal   Collection Time: 04/15/15 11:50 AM  Result Value Ref Range   Sodium 139 135 - 145 mmol/L   Potassium 4.6 3.5 - 5.1 mmol/L   Chloride 101 101 - 111 mmol/L   CO2 31 22 - 32 mmol/L   Glucose, Bld 107 (H) 65 - 99 mg/dL   BUN 18 6 - 20 mg/dL   Creatinine, Ser 0.88 0.44 - 1.00 mg/dL   Calcium 10.0 8.9 - 10.3 mg/dL   Total Protein 7.3 6.5 - 8.1 g/dL   Albumin 4.3 3.5 - 5.0 g/dL   AST 35 15 - 41 U/L   ALT 32 14 - 54 U/L   Alkaline Phosphatase 54 38 - 126 U/L   Total Bilirubin 0.6 0.3 - 1.2 mg/dL   GFR calc non Af Amer >60 >60 mL/min   GFR calc Af Amer >60 >60 mL/min    Comment: (NOTE) The eGFR has been calculated using the CKD EPI equation. This calculation has not been validated in all clinical situations. eGFR's persistently <60 mL/min signify possible Chronic Kidney Disease.    Anion gap 7 5 - 15  Protime-INR     Status: None   Collection Time: 04/15/15 11:50 AM  Result Value Ref Range   Prothrombin Time 12.5 11.6 - 15.2 seconds   INR 0.91 0.00 - 1.49   Dg Lumbar Spine 2-3 Views  04/15/2015   CLINICAL DATA:  Preoperative ablation prior lumbar surgery on April 16, 2015  EXAM: LUMBAR SPINE - 2-3 VIEW  COMPARISON:  Chest x-ray of March 06, 2012 for numbering purposes  FINDINGS: There is no evidence of lumbar spine fracture. Alignment is normal. Intervertebral disc spaces are  maintained. There is gentle dextrocurvature of the mid lumbar spine. The pedicles and transverse processes are intact. There is mild facet joint  hypertrophy at L4-5 and L5-S1. The observed portions of the sacrum are normal.  IMPRESSION: There is no compression fracture nor high-grade disc space narrowing. There is mild facet joint hypertrophy at L4-5 and L5-S1.   Electronically Signed   By: David  Martinique M.D.   On: 04/15/2015 14:53    Review of Systems  Constitutional: Negative.   HENT: Negative.   Eyes: Negative.   Respiratory: Negative.   Cardiovascular: Negative.   Gastrointestinal: Negative.   Genitourinary: Negative.   Musculoskeletal: Positive for back pain.  Skin: Negative.   Neurological: Positive for focal weakness.  Endo/Heme/Allergies: Negative.   Psychiatric/Behavioral: Negative.     Blood pressure 138/62, pulse 82, temperature 98.7 F (37.1 C), temperature source Oral, resp. rate 20, height $RemoveBe'5\' 4"'RIoaUIUnG$  (1.626 m), weight 60.782 kg (134 lb), SpO2 100 %. Physical Exam  Constitutional: She appears well-developed.  HENT:  Head: Normocephalic.  Eyes: Pupils are equal, round, and reactive to light.  Neck: Normal range of motion.  Cardiovascular: Normal rate.   Respiratory: Effort normal.  GI: Soft.  Musculoskeletal:  Partial Foot Drop on the right.  Neurological:  Weakness of right Foot  Skin: Skin is warm.  Psychiatric: She has a normal mood and affect.     Assessment/Plan Central Decompression at L-4-L-5 with a Microdiscectomy and Microdiscectomy at L-5-S-1  Alexys Gassett A 04/16/2015, 2:43 PM

## 2015-04-16 NOTE — Anesthesia Postprocedure Evaluation (Signed)
Anesthesia Post Note  Patient: Laurie Mcclain  Procedure(s) Performed: Procedure(s) (LRB): CENTRAL DECOMPRESSION L4 - L5/MICRODISCECTOMY AND HEMI-LAMINECTOMY L5 - S1 ON THE LEFT  2 LEVELS (Left)  Anesthesia type: general  Patient location: PACU  Post pain: Pain level controlled  Post assessment: Patient's Cardiovascular Status Stable  Last Vitals:  Filed Vitals:   04/16/15 1830  BP: 110/54  Pulse: 100  Temp: 37 C  Resp: 14    Post vital signs: Reviewed and stable  Level of consciousness: sedated  Complications: No apparent anesthesia complications

## 2015-04-16 NOTE — Transfer of Care (Signed)
Immediate Anesthesia Transfer of Care Note  Patient: Laurie Mcclain  Procedure(s) Performed: Procedure(s): CENTRAL DECOMPRESSION L4 - L5/MICRODISCECTOMY AND HEMI-LAMINECTOMY L5 - S1 ON THE LEFT  2 LEVELS (Left)  Patient Location: PACU  Anesthesia Type:General  Level of Consciousness:  sedated, patient cooperative and responds to stimulation  Airway & Oxygen Therapy:Patient Spontanous Breathing and Patient connected to face mask oxgen  Post-op Assessment:  Report given to PACU RN and Post -op Vital signs reviewed and stable  Post vital signs:  Reviewed and stable  Last Vitals:  Filed Vitals:   04/16/15 1343  BP: 138/62  Pulse: 82  Temp: 37.1 C  Resp: 20    Complications: No apparent anesthesia complications

## 2015-04-16 NOTE — Interval H&P Note (Signed)
History and Physical Interval Note:  04/16/2015 2:58 PM  Laurie PlowmanBrenda A Mcclain  has presented today for surgery, with the diagnosis of herniated lumbar disc L4 - L5 - L5 - S1 on the left  The various methods of treatment have been discussed with the patient and family. After consideration of risks, benefits and other options for treatment, the patient has consented to  Procedure(s): CENTRAL DECOMPRESSION L4 - L5/MICRODISCECTOMY AND HEMI-LAMINECTOMY L5 - S1 ON THE LEFT  2 LEVELS (Left) as a surgical intervention .  The patient's history has been reviewed, patient examined, no change in status, stable for surgery.  I have reviewed the patient's chart and labs.  Questions were answered to the patient's satisfaction.     Haniel Fix A

## 2015-04-16 NOTE — Discharge Instructions (Signed)
For the first few days, remove your dressing, tape plastic dressing over your incision.  Take your shower, then remove the plastic dressing and put a clean dressing on. After three days you can shower without the plastic barrier.  Starting Friday, take aspirin 325mg  twice daily for one week.  Call Dr. Darrelyn HillockGioffre if any wound complications or temperature of 101 degrees F or over.  Call the office for an appointment to see Dr. Darrelyn HillockGioffre in two weeks: [phone] and ask for Dr. Jeannetta EllisGioffre's nurse, Mackey Birchwoodammy Johnson.

## 2015-04-16 NOTE — Anesthesia Procedure Notes (Signed)
Procedure Name: Intubation Date/Time: 04/16/2015 3:29 PM Performed by: Early OsmondEARGLE, Manny Vitolo E Pre-anesthesia Checklist: Patient identified, Emergency Drugs available, Suction available and Patient being monitored Patient Re-evaluated:Patient Re-evaluated prior to inductionOxygen Delivery Method: Circle System Utilized Preoxygenation: Pre-oxygenation with 100% oxygen Intubation Type: IV induction Ventilation: Mask ventilation without difficulty Laryngoscope Size: Miller and 2 Grade View: Grade I Tube type: Oral Tube size: 7.0 mm Number of attempts: 1 Airway Equipment and Method: Stylet Placement Confirmation: ETT inserted through vocal cords under direct vision,  positive ETCO2 and breath sounds checked- equal and bilateral Secured at: 20 cm Tube secured with: Tape Dental Injury: Teeth and Oropharynx as per pre-operative assessment

## 2015-04-16 NOTE — Anesthesia Preprocedure Evaluation (Addendum)
Anesthesia Evaluation  Patient identified by MRN, date of birth, ID band Patient awake    Reviewed: Allergy & Precautions, H&P , NPO status , Patient's Chart, lab work & pertinent test results  History of Anesthesia Complications Negative for: history of anesthetic complications  Airway Mallampati: II  TM Distance: >3 FB Neck ROM: full    Dental  (+) Teeth Intact, Dental Advidsory Given   Pulmonary former smoker,  breath sounds clear to auscultation        Cardiovascular hypertension, Pt. on medications and Pt. on home beta blockers negative cardio ROS  Rhythm:regular Rate:Normal     Neuro/Psych  Headaches, PSYCHIATRIC DISORDERS Anxiety    GI/Hepatic negative GI ROS, Neg liver ROS,   Endo/Other  negative endocrine ROS  Renal/GU negative Renal ROS     Musculoskeletal   Abdominal   Peds  Hematology negative hematology ROS (+)   Anesthesia Other Findings   Reproductive/Obstetrics negative OB ROS                            Anesthesia Physical Anesthesia Plan  ASA: II  Anesthesia Plan: General   Post-op Pain Management:    Induction: Intravenous  Airway Management Planned: Oral ETT  Additional Equipment:   Intra-op Plan:   Post-operative Plan: Extubation in OR  Informed Consent: I have reviewed the patients History and Physical, chart, labs and discussed the procedure including the risks, benefits and alternatives for the proposed anesthesia with the patient or authorized representative who has indicated his/her understanding and acceptance.   Dental Advisory Given  Plan Discussed with: Anesthesiologist, CRNA and Surgeon  Anesthesia Plan Comments:        Anesthesia Quick Evaluation

## 2015-04-16 NOTE — Brief Op Note (Signed)
04/16/2015  5:41 PM  PATIENT:  Laurie Mcclain  54 y.o. female  PRE-OPERATIVE DIAGNOSIS:  herniated lumbar disc L4 - L5 and Spinal Stenosis.Herniated Lumbar Disc at  L5 - S1 on the left  POST-OPERATIVE DIAGNOSIS: Same as Pre-Opt  PROCEDURE:  Procedure(s): CENTRAL DECOMPRESSION L4 - L5/MICRODISCECTOMY AND HEMI-LAMINECTOMY L5 - S1 ON THE LEFT  2 LEVELS (Left).Spinal Stenosis at L-4-L-5.  SURGEON:  Surgeon(s) and Role:    * Ranee Gosselinonald Verina Galeno, MD - Primary    * Jene EveryJeffrey Beane, MD - Assisting     ASSISTANTS: Jene EveryJeffrey Beane MD  ANESTHESIA:   general  EBL:     BLOOD ADMINISTERED:none  DRAINS: none   LOCAL MEDICATIONS USED:  MARCAINE  20cc of 0.50% with Epinephrine at start of Case and 20cc of Exparel at the end of the case.   SPECIMEN:  No Specimen  DISPOSITION OF SPECIMEN:  N/A  COUNTS:  YES  TOURNIQUET:  * No tourniquets in log *  DICTATION: .Other Dictation: Dictation Number [id number]  PLAN OF CARE: Admit for overnight observation  PATIENT DISPOSITION:  Stable In OR   Delay start of Pharmacological VTE agent (>24hrs) due to surgical blood loss or risk of bleeding: yes

## 2015-04-17 ENCOUNTER — Encounter (HOSPITAL_COMMUNITY): Payer: Self-pay | Admitting: Orthopedic Surgery

## 2015-04-17 DIAGNOSIS — M5127 Other intervertebral disc displacement, lumbosacral region: Secondary | ICD-10-CM | POA: Diagnosis not present

## 2015-04-17 MED ORDER — HYDROCODONE-ACETAMINOPHEN 5-325 MG PO TABS: 1.0000 | ORAL_TABLET | ORAL | Status: DC | PRN

## 2015-04-17 MED ORDER — METHOCARBAMOL 500 MG PO TABS: 500.0000 mg | ORAL_TABLET | Freq: Three times a day (TID) | ORAL | Status: DC | PRN

## 2015-04-17 NOTE — Op Note (Signed)
NAME:  Laurie Mcclain, Laurie Mcclain                 ACCOUNT NO.:  [id number]  MEDICAL RECORD NO.:  [id number]  LOCATION:  1603                         [facility]  PHYSICIAN:  Georges Lynchonald A. Nikodem Leadbetter, M.D.DATE OF BIRTH:  1960/12/17  DATE OF PROCEDURE:  04/16/2015 DATE OF DISCHARGE:                              OPERATIVE REPORT   SURGEON:  Georges Lynchonald A. Darrelyn HillockGioffre, M.D.  ASSISTANT:  Jene EveryJeffrey Beane, M.D.  PREOPERATIVE DIAGNOSES: 1. Central stenosis at L4-5. 2. Herniated lumbar disk at L4-5 central to the right. 3. Herniated lumbar disk at L5-S1 on the left. 4. Partial footdrop on the right.  POSTOPERATIVE DIAGNOSES: 1. Central stenosis at L4-5. 2. Herniated lumbar disk at L4-5 central to the right. 3. Herniated lumbar disk at L5-S1 on the left. 4. Partial footdrop on the right.  OPERATIONS: 1. Central decompressive lumbar laminectomy at L4-5 for central     stenosis. 2. Microdiskectomy at L4-5 on the right. 3. Hemilaminectomy at L5-S1 on the left. 4. Microdiskectomy at L5-S1 on the left.  Both were under     microdiskectomy at both levels.  DESCRIPTION OF PROCEDURE:  Under general anesthesia with the patient on the spinal frame, routine orthopedic prepping and draping, the lower back was carried out, the appropriate time-out was first carried out.  I also marked the appropriate right side of the back in the holding area where her partial footdrop was.  At this time, after the time-out, two needles were placed in the back for localization purposes and x-ray was taken.  Following that, an incision was made over the L4-5 and L5-S1 spaces.  Bleeders were identified and cauterized.  I then separated the muscle from the lamina and spinous processes bilaterally.  Another x-ray was taken with two Kocher clamps in place.  At that time, we then inserted our McCullough retractors and I removed the spinous process of L4 and went down centrally.  At L4-5, we did a complete decompression of this area and the  sac was quite tight.  We now at the lateral recesses bilaterally, opened up the recesses.  We then went over to the right where footdrop was and gently retracted the L5 root and the dura and identified a large complex of epidural veins to be cauterized.  We then went down, identified the disk space.  Another x-ray was taken.  Then, a cruciate incision made in the posterior longitudinal ligament and a diskectomy was carried out.  Note, we utilized the nerve hook to tease out the disk material and also utilized the Epstein curettes to curette the subligamentous space.  We went medially, laterally, distally and proximally, made sure we had complete decompression.  When this was completed, we were able to easily pass a hockey-stick out the foramen for the 5 root at this level.  We also went over the other side, make sure the recess was decompressed.  We were able to easily pass the hockey-stick out the 5 root and even superiorly on both sides as well. We thoroughly irrigated out the area, loosely applied some thrombin- soaked Gelfoam.  We then went down and did a hemilaminectomy in the usual fashion.  Note, the microscope was used for  both levels.  We went down and did a hemilaminectomy at L5-S1 on the left.  At this particular time, we now decompressed the lateral recess and the root was quite tight.  We gently retracted the nerve root, the S1 root and identified the disk space.  There was a large herniated disk.  A cruciate incision was made to the posterior longitudinal ligament and a complete diskectomy was carried out.  Note, there was an extreme amount of disk material in the L5-S1 space.  When we completed this, we were able to easily move the root and the dura.  We were able to easily pass the hockey-stick out the foramina proximally and distally without any problems.  We thoroughly irrigated out the area, loosely applied some thrombin-soaked Gelfoam here, closed the wound with #1  Vicryl.  I left a small distal deep and proximal part of the wound, slightly opened for drainage purposes.  I then injected 20 mL of Exparel onto the muscle and soft subcutaneous tissues.  Note, at the beginning of the case, I injected 20 mL of 0.5% Marcaine with epinephrine for control of the bleeding.  We then closed the remaining part of the wound with #1 Vicryl.  The skin was closed with metal staples and a sterile Neosporin dressing was applied.  Right before the surgery, she was given 2 g of IV Ancef.  Sterile dressings were applied.          ______________________________ Georges Lynch Darrelyn Hillock, M.D.     RAG/MEDQ  D:  04/16/2015  T:  04/17/2015  Job:  [id number]

## 2015-04-17 NOTE — Evaluation (Signed)
Physical Therapy Evaluation Patient Details Name: Laurie Mcclain MRN: [id number] DOB: February 06, 1961 Today's Date: 04/17/2015   History of Present Illness  s/p decomp L4-5, L 5- S1  Clinical Impression  Pt admitted with above diagnosis. Pt currently with functional limitations due to the deficits listed below (see PT Problem List). Pt will benefit from skilled PT to increase their independence and safety with mobility to allow discharge to the venue listed below.    Pt became dizzy, diaphoretic after amb to bathroom, states she "feels funny" even after reclined in chair; BP 105/53, sats 83% on RA, HR 50s--RN and NT notified; O2 replaced by PT at 2L and sats 91%, pt with cool hands and low perfusion so unsure if reading completely accurate Will attempt to see later today for further stair and gait training;     Follow Up Recommendations No PT follow up    Equipment Recommendations  None recommended by PT    Recommendations for Other Services       Precautions / Restrictions Precautions Precautions: Back Precaution Comments: handout given      Mobility  Bed Mobility Overal bed mobility: Needs Assistance Bed Mobility: Rolling;Sidelying to Sit Rolling: Min guard Sidelying to sit: Min assist;Min guard       General bed mobility comments: cues for technique, min to min guard to bring trunk to upright  Transfers Overall transfer level: Needs assistance Equipment used: None Transfers: Sit to/from Stand Sit to Stand: Min assist         General transfer comment: cues for posture and hand placement, very guarded  Ambulation/Gait Ambulation/Gait assistance: Min guard Ambulation Distance (Feet): 10 Feet (x2) Assistive device: None Gait Pattern/deviations: Step-through pattern;Decreased stride length;Trunk flexed;Narrow base of support     General Gait Details: cues for posture, guarded gait, limited by dizziness  Stairs            Wheelchair Mobility    Modified  Rankin (Stroke Patients Only)       Balance                                             Pertinent Vitals/Pain Pain Assessment: 0-10 Pain Score: 3  Pain Location: back  Pain Intervention(s): Limited activity within patient's tolerance;Monitored during session;Repositioned    Home Living Family/patient expects to be discharged to:: Private residence Living Arrangements: Spouse/significant other   Type of Home: House Home Access: Stairs to enter Entrance Stairs-Rails: Doctor, general practice of Steps: 6 Home Layout: Two level;Able to live on main level with bedroom/bathroom Home Equipment: Gilmer Mor - single point;Shower seat      Prior Function Level of Independence: Independent         Comments: used cane for a few days d/t pain     Hand Dominance        Extremity/Trunk Assessment   Upper Extremity Assessment: Defer to OT evaluation           Lower Extremity Assessment: Overall WFL for tasks assessed (pt had bil "foot drop" prior to surgery but grossly 4/5 bil DF today)         Communication   Communication: No difficulties  Cognition Arousal/Alertness: Awake/alert Behavior During Therapy: WFL for tasks assessed/performed Overall Cognitive Status: Within Functional Limits for tasks assessed  General Comments      Exercises        Assessment/Plan    PT Assessment Patient needs continued PT services  PT Diagnosis Difficulty walking   PT Problem List Decreased activity tolerance;Decreased mobility;Decreased knowledge of precautions  PT Treatment Interventions DME instruction;Gait training;Stair training;Functional mobility training;Patient/family education   PT Goals (Current goals can be found in the Care Plan section) Acute Rehab PT Goals Patient Stated Goal: home today PT Goal Formulation: With patient Time For Goal Achievement: 04/18/15 Potential to Achieve Goals: Good    Frequency  Min 6X/week   Barriers to discharge        Co-evaluation               End of Session   Activity Tolerance: Patient limited by fatigue Patient left: in chair;with call bell/phone within reach;with family/visitor present Nurse Communication: Mobility status    Functional Assessment Tool Used: clinical judgement Functional Limitation: Mobility: Walking and moving around Mobility: Walking and Moving Around Current Status ([id number](G8978): At least 1 percent but less than 20 percent impaired, limited or restricted Mobility: Walking and Moving Around Goal Status [phone](G8979): At least 1 percent but less than 20 percent impaired, limited or restricted    Time: 0853-0923 PT Time Calculation (min) (ACUTE ONLY): 30 min   Charges:   PT Evaluation $Initial PT Evaluation Tier I: 1 Procedure PT Treatments $Therapeutic Activity: 8-22 mins   PT G Codes:   PT G-Codes **NOT FOR INPATIENT CLASS** Functional Assessment Tool Used: clinical judgement Functional Limitation: Mobility: Walking and moving around Mobility: Walking and Moving Around Current Status ([id number](G8978): At least 1 percent but less than 20 percent impaired, limited or restricted Mobility: Walking and Moving Around Goal Status [phone]([id number]): At least 1 percent but less than 20 percent impaired, limited or restricted    [facility] LLCWILLIAMS,Ruari Duggan 04/17/2015, 12:28 PM

## 2015-04-17 NOTE — Evaluation (Signed)
Occupational Therapy One Time Evaluation Patient Details Name: Laurie PlowmanBrenda A Mcclain MRN: [id number] DOB: 04/23/1961 Today's Date: 04/17/2015    History of Present Illness s/p decomp L4-5, L 5- S1   Clinical Impression   Pt up to bathe and dress as well as practice on and off commode/in out of shower. Pt did well and tolerated activity this session well. Family present for part of session. All education completed. No DME needs for OT. Educated on where to obtain AE for LB self care and also the toilet aid. Pt ok to d/c from OT standpoint.    Follow Up Recommendations  No OT follow up;Supervision - Intermittent    Equipment Recommendations  None recommended by OT    Recommendations for Other Services       Precautions / Restrictions Precautions Precautions: Back Precaution Comments: handout given by PT; also reviewed by OT      Mobility Bed Mobility           General bed mobility comments: pt in chair  Transfers Overall transfer level: Needs assistance Equipment used: None Transfers: Sit to/from Stand Sit to Stand: Min guard         General transfer comment: increased time, min verbal cues for back precautions, min assist to descend to EOB. min guard to stand.     Balance                                            ADL Overall ADL's : Needs assistance/impaired Eating/Feeding: Independent;Sitting   Grooming: Wash/dry hands;Min guard;Standing   Upper Body Bathing: Set up;Supervision/ safety;Sitting   Lower Body Bathing: Minimal assistance;Sit to/from stand   Upper Body Dressing : Supervision/safety;Set up;Sitting   Lower Body Dressing: Min guard;Sit to/from stand   Toilet Transfer: Min guard;Ambulation;Comfort height toilet;Grab bars   Toileting- Clothing Manipulation and Hygiene: Moderate assistance;Sit to/from stand   Tub/ Shower Transfer: Walk-in shower;Min guard     General ADL Comments: Pt appearing a little anxious as session  started since she was nauseous earlier and not feeling well but as session progressed, pt became more relaxed. Educated on all back precautions and how they apply to ADL. she has a shower chair and riser. Discussed use of regular toilet with grab bar use versus riser depending on the height of her commode and if the riser would put her up too high from the floor.  Educated on making sure her feet touch the floor when she sits on commode and if riser makes her feet off the floor then she may just need to consider regular height and bar. Educated on toilet aid as she cant reach around to posterior periarea without breaking her precautions. Explained where she can obtain toilet aid as well as a long handle sponge to be able to wash her bottom. She does well wtih crossing Les up to wash feet/lower legs. She transfers into the bathroom very cautiously and slowly. O2 sats on RA 99% with activity. Min/mod cues during session for back precautions to adhere to them.      Vision     Perception     Praxis      Pertinent Vitals/Pain       Hand Dominance     Extremity/Trunk Assessment Upper Extremity Assessment Upper Extremity Assessment: Overall WFL for tasks assessed          Communication Communication Communication:  No difficulties   Cognition Arousal/Alertness: Awake/alert Behavior During Therapy: WFL for tasks assessed/performed Overall Cognitive Status: Within Functional Limits for tasks assessed                     General Comments       Exercises       Shoulder Instructions      Home Living Family/patient expects to be discharged to:: Private residence Living Arrangements: Spouse/significant other   Type of Home: House Home Access: Stairs to enter [employer] of Steps: 6 Entrance Stairs-Rails: Right;Left Home Layout: Two level;Able to live on main level with bedroom/bathroom     Bathroom Shower/Tub: Producer, television/film/video: Standard      Home Equipment: Cane - single point;Shower seat;Toilet riser;Grab bars - toilet          Prior Functioning/Environment Level of Independence: Independent        Comments: used cane for a few days d/t pain    OT Diagnosis: Generalized weakness   OT Problem List:     OT Treatment/Interventions:      OT Goals(Current goals can be found in the care plan section) Acute Rehab OT Goals Patient Stated Goal: home OT Goal Formulation: With patient  OT Frequency:     Barriers to D/C:            Co-evaluation              End of Session    Activity Tolerance: Patient tolerated treatment well Patient left: in chair;with call bell/phone within reach;with family/visitor present   Time: 1050-1135 OT Time Calculation (min): 45 min Charges:  OT General Charges $OT Visit: 1 Procedure OT Evaluation $Initial OT Evaluation Tier I: 1 Procedure OT Treatments $Self Care/Home Management : 8-22 mins $Therapeutic Activity: 8-22 mins G-Codes: OT G-codes **NOT FOR INPATIENT CLASS** Functional Assessment Tool Used: clinical judgement Functional Limitation: Self care Self Care Current Status (W1191): At least 20 percent but less than 40 percent impaired, limited or restricted Self Care Goal Status (Y7829): At least 20 percent but less than 40 percent impaired, limited or restricted Self Care Discharge Status [phone]): At least 20 percent but less than 40 percent impaired, limited or restricted  Laurie Mcclain  [phone] 04/17/2015, 12:53 PM

## 2015-04-17 NOTE — Progress Notes (Signed)
Subjective: 1 Day Post-Op Procedure(s) (LRB): CENTRAL DECOMPRESSION L4 - L5/MICRODISCECTOMY AND HEMI-LAMINECTOMY L5 - S1 ON THE LEFT  2 LEVELS (Left) Patient reports pain as 2 on 0-10 scale.  Much better today Will DC. Minimal weakness on the left foot as she was Pre-Op. Her Right foot weakness is now cleared up.  Objective: Vital signs in last 24 hours: Temp:  [98.5 F (36.9 C)-99.3 F (37.4 C)] 98.8 F (37.1 C) (06/02 0526) Pulse Rate:  [74-126] 74 (06/02 0526) Resp:  [14-20] 16 (06/02 0526) BP: (100-138)/(47-73) 124/63 mmHg (06/02 0526) SpO2:  [100 %] 100 % (06/02 0526) Weight:  [60.782 kg (134 lb)] 60.782 kg (134 lb) (06/01 1343)  Intake/Output from previous day: 06/01 0701 - 06/02 0700 In: 4585 [P.O.:2180; I.V.:2250; IV Piggyback:155] Out: 1625 [Urine:1625] Intake/Output this shift:     Recent Labs  04/15/15 1150  HGB 14.4    Recent Labs  04/15/15 1150  WBC 4.4  RBC 5.13*  HCT 44.1  PLT 220    Recent Labs  04/15/15 1150  NA 139  K 4.6  CL 101  CO2 31  BUN 18  CREATININE 0.88  GLUCOSE 107*  CALCIUM 10.0    Recent Labs  04/15/15 1150  INR 0.91    Minimal weakness in dorsiflexion of Left Foot. Right foot is now much better.  Assessment/Plan: 1 Day Post-Op Procedure(s) (LRB): CENTRAL DECOMPRESSION L4 - L5/MICRODISCECTOMY AND HEMI-LAMINECTOMY L5 - S1 ON THE LEFT  2 LEVELS (Left) Discharge home with home health  Dagny Fiorentino A 04/17/2015, 7:17 AM

## 2015-04-17 NOTE — Progress Notes (Signed)
Assessment unchanged. Pt and husband verbalized understanding of dc instructions through teach back. Back dressing changed prior to dc home. Dressing supplies provided. Scripts x 2 given as provided by MD. Discharged via wc to front entrance to meet awaiting vehicle to carry home. Accompanied by NT and husband.

## 2015-04-17 NOTE — Progress Notes (Signed)
   04/17/15 1400  PT Visit Information  Last PT Received On 04/17/15  Assistance Needed +1  History of Present Illness s/p decomp L4-5, L 5- S1  PT Time Calculation  PT Start Time (ACUTE ONLY) 1344  PT Stop Time (ACUTE ONLY) 1401  PT Time Calculation (min) (ACUTE ONLY) 17 min  Subjective Data  Patient Stated Goal home  Precautions  Precautions Back  Precaution Comments handout given, reviewed precautions   Pain Assessment  Pain Assessment 0-10  Pain Score 1  Pain Location back  Pain Descriptors / Indicators Sore  Pain Intervention(s) Limited activity within patient's tolerance;Monitored during session  Cognition  Arousal/Alertness Awake/alert  Behavior During Therapy WFL for tasks assessed/performed  Overall Cognitive Status Within Functional Limits for tasks assessed  Transfers  Overall transfer level Needs assistance  Equipment used None  Transfers Sit to/from Stand  Sit to Stand Supervision  General transfer comment incr time, cues for precautions  Ambulation/Gait  Ambulation/Gait assistance Supervision  Ambulation Distance (Feet) 200 Feet  Assistive device None  General Gait Details cues for posture  Gait Pattern/deviations Step-through pattern  Stairs Yes  Stair Management One rail Left;Step to pattern;Alternating pattern  Number of Stairs 4  General stair comments cues for sequence  PT - End of Session  Activity Tolerance Patient tolerated treatment well  Patient left in chair;with call bell/phone within reach;with nursing/sitter in room  Nurse Communication Mobility status  PT - Assessment/Plan  PT Plan Current plan remains appropriate  PT Frequency (ACUTE ONLY) Min 6X/week  Follow Up Recommendations No PT follow up  PT equipment None recommended by PT  PT Goal Progression  Progress towards PT goals Progressing toward goals  Acute Rehab PT Goals  PT Goal Formulation With patient  Time For Goal Achievement 04/18/15  Potential to Achieve Goals Good  PT  G-Codes **NOT FOR INPATIENT CLASS**  Functional Assessment Tool Used clinical judgement  Mobility: Walking and Moving Around Goal Status (Z6109(G8979) CI  Mobility: Walking and Moving Around Discharge Status (U0454(G8980) CI  PT General Charges  $$ ACUTE PT VISIT 1 Procedure  PT Treatments  $Gait Training 8-22 mins

## 2015-04-25 NOTE — Discharge Summary (Signed)
Physician Discharge Summary  Patient ID: Laurie Mcclain MRN: [id number] DOB/AGE: 05-07-1961 54 y.o.  Admit date: 04/16/2015 Discharge date: 04/25/2015  Admission Diagnoses:Spinal Stenosis and Herniated Lumbar Disc.  Discharge Diagnoses:  Active Problems:   Spinal stenosis, lumbar region, with neurogenic claudication   Discharged Condition: good  Hospital Course: No post-Op problems except Constipation  Consults: Physical Therapy.  Significant Diagnostic Studies: Xrays in OR.  Treatments: antibiotics: Ancef  Discharge Exam: Blood pressure 105/53, pulse 58, temperature 98.8 F (37.1 C), temperature source Oral, resp. rate 16, height 5\' 4"  (1.626 m), weight 60.782 kg (134 lb), SpO2 99 %. Extremities: Homans sign is negative, no sign of DVT and Good function in lowers.  Disposition: 01-Home or Self Care  Discharge Instructions    Call MD / Call 911    Complete by:  As directed   If you experience chest pain or shortness of breath, CALL 911 and be transported to the hospital emergency room.  If you develope a fever above 101 F, pus (white drainage) or increased drainage or redness at the wound, or calf pain, call your surgeon's office.     Constipation Prevention    Complete by:  As directed   Drink plenty of fluids.  Prune juice may be helpful.  You may use a stool softener, such as Colace (over the counter) 100 mg twice a day.  Use MiraLax (over the counter) for constipation as needed.     Diet - low sodium heart healthy    Complete by:  As directed      Driving restrictions    Complete by:  As directed   No driving while taking pain medications     Increase activity slowly as tolerated    Complete by:  As directed      Lifting restrictions    Complete by:  As directed   No lifting            Medication List    STOP taking these medications        indomethacin 25 MG capsule  Commonly known as:  INDOCIN      TAKE these medications        ALPRAZolam 0.25 MG tablet   Commonly known as:  XANAX  Take 0.25 mg by mouth at bedtime as needed for anxiety or sleep.     atenolol 25 MG tablet  Commonly known as:  TENORMIN  Take 25 mg by mouth every evening.     fexofenadine 180 MG tablet  Commonly known as:  ALLEGRA  Take 180 mg by mouth daily.     fish oil-omega-3 fatty acids 1000 MG capsule  Take 1 g by mouth at bedtime.     HYDROcodone-acetaminophen 5-325 MG per tablet  Commonly known as:  NORCO/VICODIN  Take 1-2 tablets by mouth every 4 (four) hours as needed for moderate pain.     methocarbamol 500 MG tablet  Commonly known as:  ROBAXIN  Take 1 tablet (500 mg total) by mouth every 8 (eight) hours as needed for muscle spasms.     MULTI FOR HER 50+ PO  Take 1 tablet by mouth at bedtime.     ondansetron 4 MG tablet  Commonly known as:  ZOFRAN  Take 4 mg by mouth every 6 (six) hours as needed for nausea or vomiting.     sertraline 100 MG tablet  Commonly known as:  ZOLOFT  Take 0.5 tablets by mouth daily.     SYSTANE OP  Apply  1-2 drops to eye daily as needed (dry eyes.).           Follow-up Information    Follow up with Beecher Furio A, MD. Schedule an appointment as soon as possible for a visit in 2 weeks.   Specialty:  Orthopedic Surgery   Contact information:   [address] [place] [address] [phone]       Signed: Jacki Cones 04/25/2015, 7:35 AM

## 2015-10-07 IMAGING — CR DG SPINE 1V PORT
1 series · 1 of 1 positions shown · non-contrast
Comparison: 04/16/2015 at 5003 hours.

CLINICAL DATA: Intraoperative localization. L4-L5 and L5-S1
localization.

EXAM:
PORTABLE SPINE - 1 VIEW

[lateral]
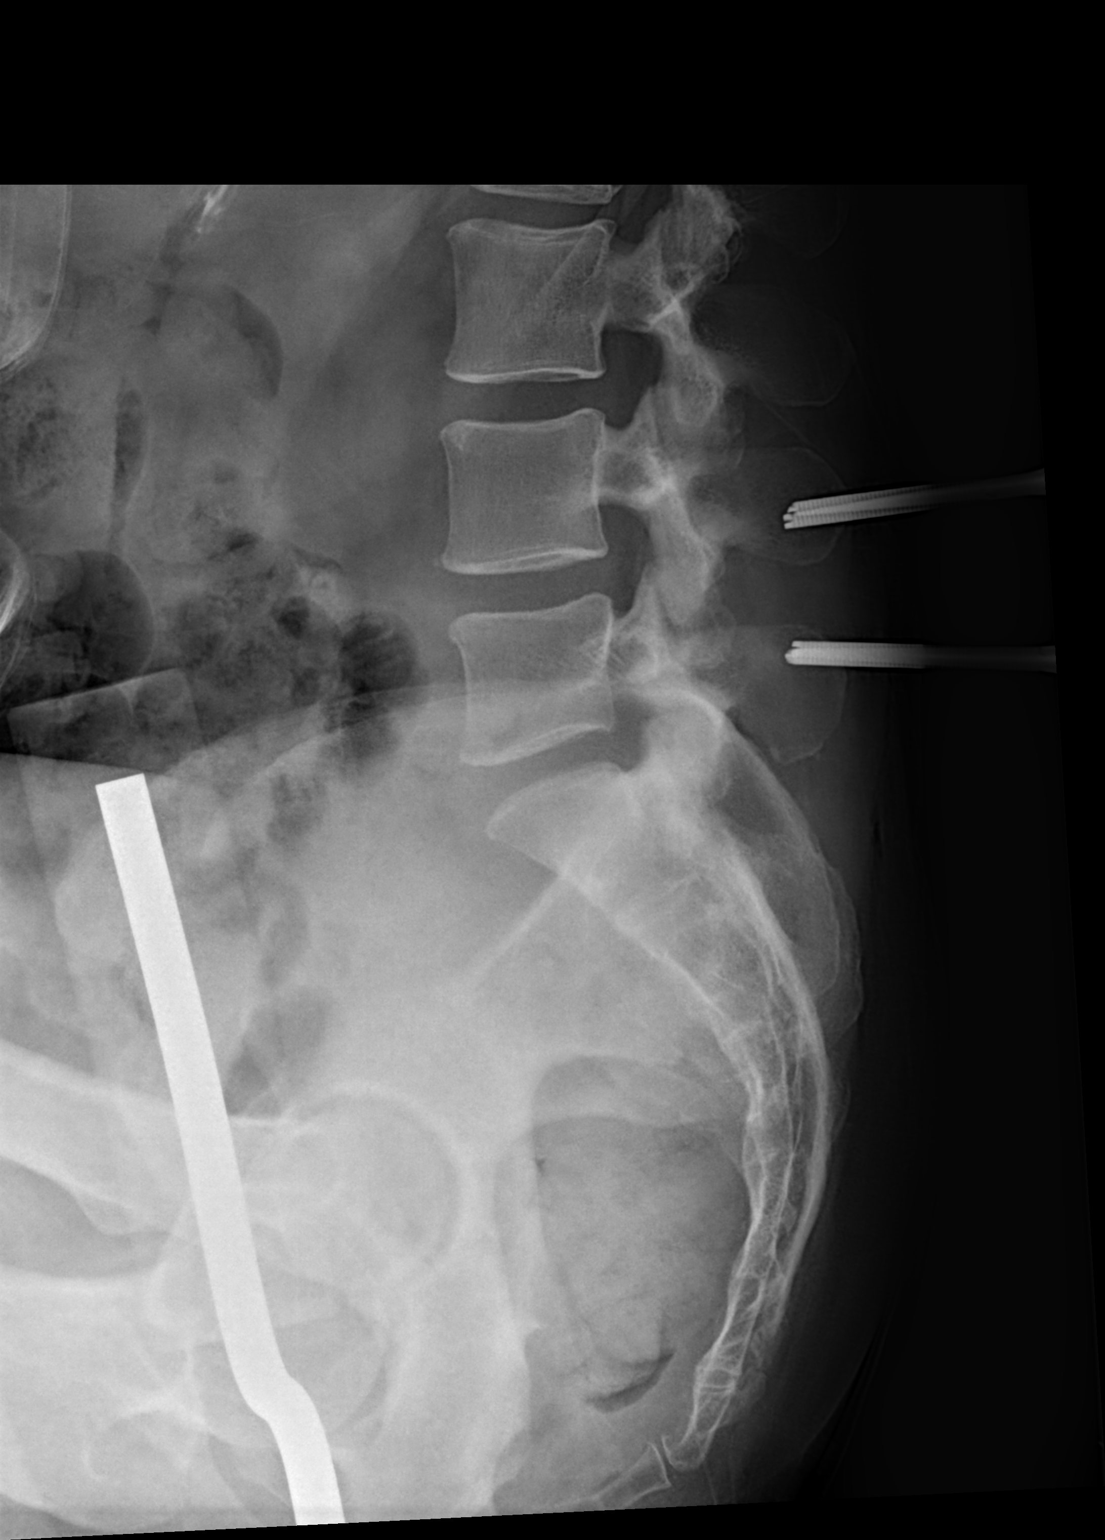

[1 of 1 positions shown; findings below may reference images not displayed]

FINDINGS: Clamps are present on the L4 and L5 spinous processes. Numbering
used on previous exams is preserved.
IMPRESSION: Intraoperative localization at L4 and L5.

## 2015-10-07 IMAGING — CR DG SPINE 1V PORT
1 series · 1 of 1 positions shown · non-contrast
Comparison: 04/15/2015

CLINICAL DATA: Lumbar disc surgery.

EXAM:
PORTABLE SPINE - 1 VIEW

[lateral]
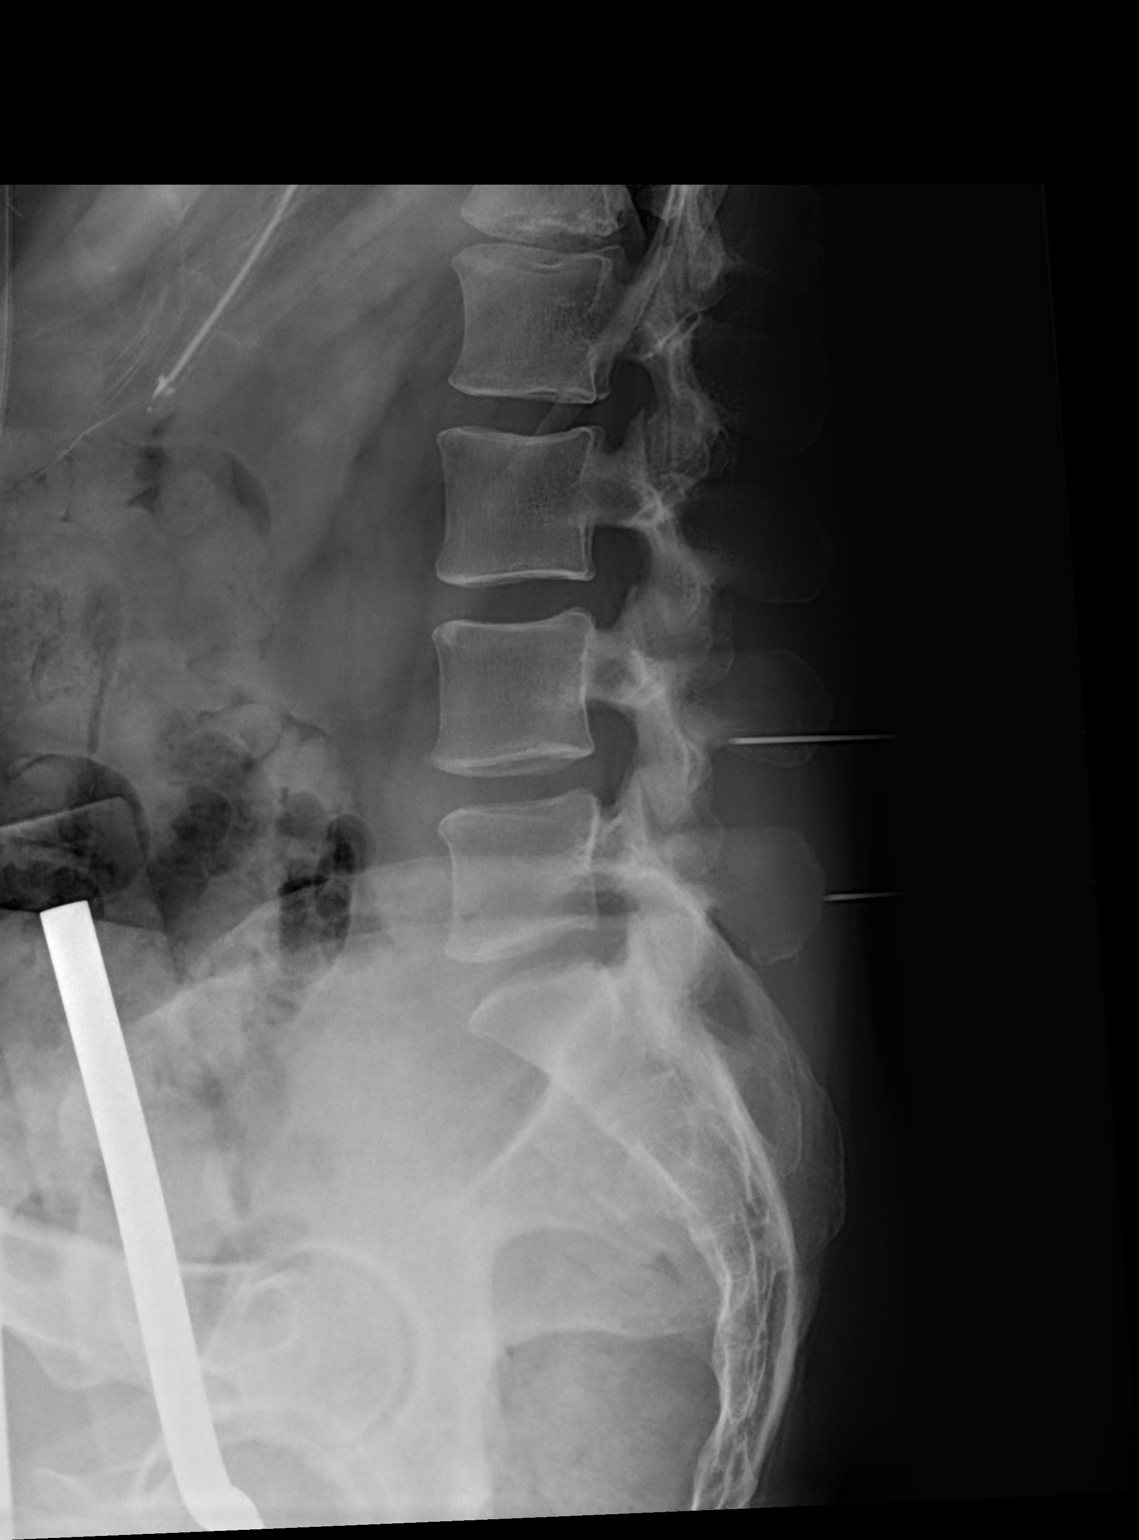

[1 of 1 positions shown; findings below may reference images not displayed]

FINDINGS: Cross-table lateral view obtained intraoperatively at 5996 hours
demonstrates needle placement posteriorly. The more superior needle
lies lung the inferior margin of the L4 spinous process. The lower
needle lies just opposite the tip of the L5 spinous process.
IMPRESSION: Intraoperative film demonstrating needle localization of the spinous
processes of L4 and L5.

## 2017-02-02 DIAGNOSIS — Z1389 Encounter for screening for other disorder: Secondary | ICD-10-CM | POA: Diagnosis not present

## 2017-02-02 DIAGNOSIS — I10 Essential (primary) hypertension: Secondary | ICD-10-CM | POA: Diagnosis not present

## 2017-02-02 DIAGNOSIS — Z0000 Encounter for general adult medical examination without abnormal findings: Secondary | ICD-10-CM | POA: Diagnosis not present

## 2017-02-17 DIAGNOSIS — Z6822 Body mass index (BMI) 22.0-22.9, adult: Secondary | ICD-10-CM | POA: Diagnosis not present

## 2017-02-17 DIAGNOSIS — Z124 Encounter for screening for malignant neoplasm of cervix: Secondary | ICD-10-CM | POA: Diagnosis not present

## 2017-02-17 DIAGNOSIS — Z01419 Encounter for gynecological examination (general) (routine) without abnormal findings: Secondary | ICD-10-CM | POA: Diagnosis not present

## 2017-04-21 DIAGNOSIS — M25562 Pain in left knee: Secondary | ICD-10-CM | POA: Diagnosis not present

## 2017-04-25 DIAGNOSIS — M25562 Pain in left knee: Secondary | ICD-10-CM | POA: Diagnosis not present

## 2017-05-06 DIAGNOSIS — M25562 Pain in left knee: Secondary | ICD-10-CM | POA: Diagnosis not present

## 2017-09-30 DIAGNOSIS — Z23 Encounter for immunization: Secondary | ICD-10-CM | POA: Diagnosis not present

## 2018-02-08 DIAGNOSIS — Z0000 Encounter for general adult medical examination without abnormal findings: Secondary | ICD-10-CM | POA: Diagnosis not present

## 2018-02-08 DIAGNOSIS — Z136 Encounter for screening for cardiovascular disorders: Secondary | ICD-10-CM | POA: Diagnosis not present

## 2018-02-22 DIAGNOSIS — Z01419 Encounter for gynecological examination (general) (routine) without abnormal findings: Secondary | ICD-10-CM | POA: Diagnosis not present

## 2018-02-22 DIAGNOSIS — Z1231 Encounter for screening mammogram for malignant neoplasm of breast: Secondary | ICD-10-CM | POA: Diagnosis not present

## 2018-03-20 DIAGNOSIS — H43813 Vitreous degeneration, bilateral: Secondary | ICD-10-CM | POA: Diagnosis not present

## 2018-03-20 DIAGNOSIS — H35411 Lattice degeneration of retina, right eye: Secondary | ICD-10-CM | POA: Diagnosis not present

## 2018-03-20 DIAGNOSIS — H33322 Round hole, left eye: Secondary | ICD-10-CM | POA: Diagnosis not present

## 2018-04-11 DIAGNOSIS — M1712 Unilateral primary osteoarthritis, left knee: Secondary | ICD-10-CM | POA: Diagnosis not present

## 2018-04-17 DIAGNOSIS — M25562 Pain in left knee: Secondary | ICD-10-CM | POA: Diagnosis not present

## 2018-04-19 DIAGNOSIS — M25562 Pain in left knee: Secondary | ICD-10-CM | POA: Diagnosis not present

## 2018-04-25 DIAGNOSIS — M25562 Pain in left knee: Secondary | ICD-10-CM | POA: Diagnosis not present

## 2018-05-02 DIAGNOSIS — M25562 Pain in left knee: Secondary | ICD-10-CM | POA: Diagnosis not present

## 2018-05-04 DIAGNOSIS — M25562 Pain in left knee: Secondary | ICD-10-CM | POA: Diagnosis not present

## 2018-05-09 DIAGNOSIS — M25562 Pain in left knee: Secondary | ICD-10-CM | POA: Diagnosis not present

## 2018-05-16 DIAGNOSIS — M25562 Pain in left knee: Secondary | ICD-10-CM | POA: Diagnosis not present

## 2018-05-19 DIAGNOSIS — M25562 Pain in left knee: Secondary | ICD-10-CM | POA: Diagnosis not present

## 2018-09-16 DIAGNOSIS — Z23 Encounter for immunization: Secondary | ICD-10-CM | POA: Diagnosis not present

## 2018-11-28 DIAGNOSIS — J209 Acute bronchitis, unspecified: Secondary | ICD-10-CM | POA: Diagnosis not present

## 2021-06-23 ENCOUNTER — Other Ambulatory Visit: Payer: Self-pay | Admitting: Internal Medicine

## 2021-06-23 DIAGNOSIS — E2839 Other primary ovarian failure: Secondary | ICD-10-CM

## 2021-11-25 ENCOUNTER — Other Ambulatory Visit: Payer: PRIVATE HEALTH INSURANCE

## 2021-11-25 ENCOUNTER — Ambulatory Visit
Admission: RE | Admit: 2021-11-25 | Discharge: 2021-11-25 | Disposition: A | Discharge status: Home / Self Care | Payer: 59 | Source: Ambulatory Visit | Attending: Internal Medicine | Admitting: Internal Medicine

## 2021-11-25 DIAGNOSIS — E2839 Other primary ovarian failure: Secondary | ICD-10-CM

## 2022-07-12 ENCOUNTER — Encounter: Payer: Self-pay | Admitting: Student

## 2022-07-14 ENCOUNTER — Emergency Department (HOSPITAL_COMMUNITY): Payer: 59

## 2022-07-14 ENCOUNTER — Encounter (HOSPITAL_COMMUNITY): Payer: Self-pay

## 2022-07-14 ENCOUNTER — Emergency Department (HOSPITAL_COMMUNITY)
Admission: EM | Admit: 2022-07-14 | Discharge: 2022-07-14 | Disposition: A | Discharge status: Home / Self Care | Payer: 59 | Attending: Emergency Medicine | Admitting: Emergency Medicine

## 2022-07-14 DIAGNOSIS — M5441 Lumbago with sciatica, right side: Secondary | ICD-10-CM | POA: Diagnosis not present

## 2022-07-14 DIAGNOSIS — M549 Dorsalgia, unspecified: Secondary | ICD-10-CM | POA: Diagnosis present

## 2022-07-14 MED ORDER — DIAZEPAM 5 MG PO TABS
5.0000 mg | ORAL_TABLET | Freq: Once | ORAL | Status: AC
  Administered 2022-07-14: 5 mg via ORAL
  Filled 2022-07-14: qty 1

## 2022-07-14 MED ORDER — LIDOCAINE 5 % EX PTCH: 1.0000 | MEDICATED_PATCH | CUTANEOUS | 0 refills | Status: AC

## 2022-07-14 MED ORDER — HYDROCODONE-ACETAMINOPHEN 5-325 MG PO TABS
1.0000 | ORAL_TABLET | ORAL | Status: AC
  Administered 2022-07-14: 1 via ORAL
  Filled 2022-07-14: qty 1

## 2022-07-14 MED ORDER — HYDROCODONE-ACETAMINOPHEN 5-325 MG PO TABS
1.0000 | ORAL_TABLET | ORAL | 0 refills | Status: DC | PRN
Start: 2022-07-14 — End: 2022-08-10

## 2022-07-14 MED ORDER — METHOCARBAMOL 500 MG PO TABS: 500.0000 mg | ORAL_TABLET | Freq: Three times a day (TID) | ORAL | 0 refills | Status: DC | PRN

## 2022-07-14 MED ORDER — KETOROLAC TROMETHAMINE 15 MG/ML IJ SOLN
15.0000 mg | Freq: Once | INTRAMUSCULAR | Status: AC
  Administered 2022-07-14: 15 mg via INTRAMUSCULAR
  Filled 2022-07-14: qty 1

## 2022-07-14 MED ORDER — LIDOCAINE 5 % EX PTCH
1.0000 | MEDICATED_PATCH | CUTANEOUS | Status: DC
  Administered 2022-07-14: 1 via TRANSDERMAL
  Filled 2022-07-14: qty 1

## 2022-07-14 MED ORDER — IBUPROFEN 200 MG PO TABS
600.0000 mg | ORAL_TABLET | Freq: Once | ORAL | Status: AC
  Administered 2022-07-14: 600 mg via ORAL
  Filled 2022-07-14: qty 3

## 2022-07-14 MED ORDER — CYCLOBENZAPRINE HCL 10 MG PO TABS
5.0000 mg | ORAL_TABLET | Freq: Once | ORAL | Status: AC
  Administered 2022-07-14: 5 mg via ORAL
  Filled 2022-07-14: qty 1

## 2022-07-14 MED ORDER — ACETAMINOPHEN 500 MG PO TABS
1000.0000 mg | ORAL_TABLET | ORAL | Status: AC
  Administered 2022-07-14: 1000 mg via ORAL
  Filled 2022-07-14: qty 2

## 2022-07-14 NOTE — ED Notes (Signed)
Bladder scan with 0 for residual

## 2022-07-14 NOTE — ED Notes (Signed)
Pt requested the bed side commode and had a BM. Assessed her pain level afterward

## 2022-07-14 NOTE — ED Notes (Signed)
Pt will stand and immediately sit back down and lay down.  Not putting weight on her L leg

## 2022-07-14 NOTE — ED Provider Notes (Signed)
Care transferred to me.  MRI without emergent findings.  From a pain control perspective she seems to be a lot better after the hydrocodone given.  At this point, she feels like she has good no pain control to go home.  No high-grade stenosis seen on the MRI.  Will discharge home with neurosurgery referral.  Will give short course of hydrocodone, Robaxin, and Lidoderm.  She already has most at home.   Pricilla Loveless, MD 07/14/22 2059

## 2022-07-14 NOTE — ED Triage Notes (Signed)
Pt arrived via [facility], from home, c/o worsening back pain x10 days. Hx of spinal stenosis, and sciatic pain. Had tele visit, was given steroids. No relief of pain. States she has been unable to ambulate x3 days.

## 2022-07-14 NOTE — Discharge Instructions (Addendum)
If you develop worsening, recurrent, or continued back pain, numbness or weakness in the legs, incontinence of your bowels or bladders, numbness of your buttocks, fever, abdominal pain, or any other new/concerning symptoms then return to the ER for evaluation.  

## 2022-07-14 NOTE — ED Notes (Signed)
Pt stated she is unable to ambulate at this time and she c/o intense pain on the change in position from lying to sitting. She rated the pain 7/10.

## 2022-07-14 NOTE — ED Provider Notes (Signed)
[facility]-EMERGENCY DEPT Provider Note   CSN: [id number] Arrival date & time: 07/14/22  0857     History  Chief Complaint  Patient presents with   Back Pain    Laurie Mcclain is a 61 y.o. female.  61 year old female with a history of spinal stenosis status post L4-S1 microdiscectomy and hemilaminectomy presents emergency department back pain.  Patient states that over the weekend she started experiencing severe back pain.  Says it is continued to worsen and is now so painful that she is having difficulty walking.  Denies any focal numbness or weakness but says that the pain shoots down her right leg.  No bowel or bladder incontinence.  No difficulty urinating.  No changes to sensation when she wipes after peeing.  Denies any trauma to the area.  No known injuries.  Says she is not on blood thinners.  Saw a telemetry triage doctor who started her on a steroid pack on Monday that she has been taking without significant improvement of her pain.  Says that she has tried oxycodone/Tylenol at home, methocarbamol, meloxicam without significant improvement.  Was planning on following up with a doctor but said that the pain was so severe she wanted to come into the emergency department to be seen.   Back Pain      Home Medications Prior to Admission medications   Medication Sig Start Date End Date Taking? Authorizing Provider  ALPRAZolam Prudy Feeler) 0.25 MG tablet Take 0.25 mg by mouth at bedtime as needed for anxiety or sleep.     [provider]  atenolol (TENORMIN) 25 MG tablet Take 25 mg by mouth every evening.     [provider]  fexofenadine (ALLEGRA) 180 MG tablet Take 180 mg by mouth daily.    [provider]  fish oil-omega-3 fatty acids 1000 MG capsule Take 1 g by mouth at bedtime.     [provider]  HYDROcodone-acetaminophen (NORCO/VICODIN) 5-325 MG per tablet Take 1-2 tablets by mouth every 4 (four) hours as needed for  moderate pain. 04/17/15   Ranee Gosselin, MD  methocarbamol (ROBAXIN) 500 MG tablet Take 1 tablet (500 mg total) by mouth every 8 (eight) hours as needed for muscle spasms. 04/17/15   Ranee Gosselin, MD  Multiple Vitamins-Minerals (MULTI FOR HER 50+ PO) Take 1 tablet by mouth at bedtime.    [provider]  ondansetron (ZOFRAN) 4 MG tablet Take 4 mg by mouth every 6 (six) hours as needed for nausea or vomiting.    [provider]  Polyethyl Glycol-Propyl Glycol (SYSTANE OP) Apply 1-2 drops to eye daily as needed (dry eyes.).    [provider]  sertraline (ZOLOFT) 100 MG tablet Take 0.5 tablets by mouth daily.  02/02/15   [provider]      Allergies    Erythromycin and Other    Review of Systems   Review of Systems  Musculoskeletal:  Positive for back pain.    Physical Exam Updated Vital Signs BP 123/73 (BP Location: Right Arm)   Pulse 70   Temp 98.4 F (36.9 C) (Oral)   Resp 16   SpO2 100%  Physical Exam Vitals and nursing note reviewed.  Constitutional:      General: She is not in acute distress.    Appearance: She is well-developed.  HENT:     Head: Normocephalic and atraumatic.     Right Ear: External ear normal.     Left Ear: External ear normal.  Nose: Nose normal.  Eyes:     Extraocular Movements: Extraocular movements intact.     Conjunctiva/sclera: Conjunctivae normal.     Pupils: Pupils are equal, round, and reactive to light.  Cardiovascular:     Heart sounds: No murmur heard. Pulmonary:     Effort: Pulmonary effort is normal. No respiratory distress.  Abdominal:     General: Abdomen is flat. There is no distension.     Tenderness: There is no abdominal tenderness.  Musculoskeletal:        General: No swelling.     Cervical back: Normal range of motion and neck supple.     Right lower leg: No edema.     Left lower leg: No edema.     Comments: No spinal midline TTP in cervical, thoracic, or lumbar spine. No stepoffs  noted.   Motor: Muscle bulk and tone are normal. Strength is 5/5 in hip flexion, knee flexion and extension, ankle dorsiflexion and plantar flexion bilaterally. Full strength of great toe dorsiflexion bilaterally.  Sensory: Intact sensation to light touch in L2 though S1 dermatomes bilaterally.   Rectal Exam: Chaperoned by patient's RN tech. Intact anal wink reflex. Normal external exam. Good rectal tone with no masses palpated.  Skin:    General: Skin is warm and dry.     Capillary Refill: Capillary refill takes less than 2 seconds.  Neurological:     Mental Status: She is alert and oriented to person, place, and time. Mental status is at baseline.  Psychiatric:        Mood and Affect: Mood normal.     ED Results / Procedures / Treatments   Labs (all labs ordered are listed, but only abnormal results are displayed) Labs Reviewed - No data to display  EKG None  Radiology MR LUMBAR SPINE WO CONTRAST  Result Date: 07/14/2022 CLINICAL DATA:  Low back pain, cauda equina syndrome suspected EXAM: MRI LUMBAR SPINE WITHOUT CONTRAST TECHNIQUE: Multiplanar, multisequence MR imaging of the lumbar spine was performed. No intravenous contrast was administered. COMPARISON:  None Available. FINDINGS: Segmentation:  Standard. Alignment:  No significant listhesis. Vertebrae: Vertebral body heights are maintained. No marrow edema. No suspicious osseous lesion. Conus medullaris and cauda equina: Conus extends to the L1 level. Conus and cauda equina appear normal. Paraspinal and other soft tissues: Unremarkable. Disc levels: Imaged in the sagittal plane only, there is a left paracentral disc protrusion at T11-T12. L1-L2: Left central/subarticular disc protrusion. No canal or foraminal stenosis. Partial effacement of the left subarticular recess. L2-L3:  No canal or foraminal stenosis. L3-L4: Left central/subarticular disc protrusion and annular fissure. No canal or foraminal stenosis. Partial effacement of  the left subarticular recess. L4-L5: Evidence of prior posterior decompression. Disc bulge with superimposed central/right subarticular protrusion. Mild facet arthropathy. Mild to moderate canal stenosis. Effacement of right greater than left subarticular recesses. Minor foraminal stenosis. L5-S1: Evidence of prior posterior decompression. Disc bulge with superimposed left central/subarticular annular fissure. No canal or foraminal stenosis. IMPRESSION: Degenerative and postoperative changes as detailed above. No high-grade canal or foraminal stenosis. There is multilevel subarticular recess narrowing, greatest at L4-L5. Electronically Signed   By: Guadlupe Spanish M.D.   On: 07/14/2022 18:53    Procedures Procedures   Medications Ordered in ED Medications  lidocaine (LIDODERM) 5 % 1 patch (1 patch Transdermal Patch Applied 07/14/22 1024)  ketorolac (TORADOL) 15 MG/ML injection 15 mg (15 mg Intramuscular Given 07/14/22 1023)  acetaminophen (TYLENOL) tablet 1,000 mg (1,000 mg Oral Given 07/14/22  1024)  cyclobenzaprine (FLEXERIL) tablet 5 mg (5 mg Oral Given 07/14/22 1101)  diazepam (VALIUM) tablet 5 mg (5 mg Oral Given 07/14/22 1332)  ibuprofen (ADVIL) tablet 600 mg (600 mg Oral Given 07/14/22 1603)  HYDROcodone-acetaminophen (NORCO/VICODIN) 5-325 MG per tablet 1 tablet (1 tablet Oral Given 07/14/22 1603)    ED Course/ Medical Decision Making/ A&P Clinical Course as of 07/14/22 2052  Wed Jul 14, 2022  1253 PVR without urine.  [RP]    Clinical Course User Index [RP] Rondel Baton, MD                           Medical Decision Making Amount and/or Complexity of Data Reviewed Radiology: ordered.  Risk OTC drugs. Prescription drug management.   61 year old female with a history of spinal stenosis status post L4-S1 microdiscectomy and hemilaminectomy presents emergency department back pain.    Initial Ddx:  Lumbar radiculopathy, cauda equina, musculoskeletal pain  MDM:  Considered the  above diagnoses.  Patient did not have any focal neurologic deficits on my initial exam.  No signs of musculoskeletal pain as the patient is able to fully range her hip and does not have any focal tenderness.  Additionally the patient's description of the pain radiating down her leg was more consistent with lumbar radiculopathy.  Plan:  Tylenol Lidocaine patch Cyclobenzaprine Toradol Postvoid residual  ED Summary:  Patient had postvoid residual but had no urinary bladder.  She was given the above treatments but was still having significant pain so was given an additional dose of ibuprofen and Norco.  On repeat exam did appear that the patient may have had right great toe weakness in dorsiflexion so MRI was obtained given her significant pain. Has pending at this time she was signed out to Dr. Gwenlyn Fudge awaiting results and final disposition.  Feel the patient may have to be admitted if pain is unable to be controlled.   Dispo: Pending remainder of workup   Additional history obtained from spouse Records reviewed Care Everywhere   Final Clinical Impression(s) / ED Diagnoses Final diagnoses:  Acute right-sided low back pain with right-sided sciatica    Rx / DC Orders ED Discharge Orders     None         Rondel Baton, MD 07/14/22 2052

## 2022-07-21 ENCOUNTER — Other Ambulatory Visit: Payer: Self-pay | Admitting: Physical Medicine and Rehabilitation

## 2022-07-21 DIAGNOSIS — G8929 Other chronic pain: Secondary | ICD-10-CM

## 2022-07-30 NOTE — Discharge Instructions (Signed)

## 2022-08-02 ENCOUNTER — Ambulatory Visit
Admission: RE | Admit: 2022-08-02 | Discharge: 2022-08-02 | Disposition: A | Discharge status: Home / Self Care | Payer: 59 | Source: Ambulatory Visit | Attending: Physical Medicine and Rehabilitation | Admitting: Physical Medicine and Rehabilitation

## 2022-08-02 DIAGNOSIS — M545 Low back pain, unspecified: Secondary | ICD-10-CM

## 2022-08-02 MED ORDER — METHYLPREDNISOLONE ACETATE 40 MG/ML INJ SUSP (RADIOLOG
80.0000 mg | Freq: Once | INTRAMUSCULAR | Status: AC
  Administered 2022-08-02: 80 mg via EPIDURAL

## 2022-08-02 MED ORDER — IOPAMIDOL (ISOVUE-M 200) INJECTION 41%
1.0000 mL | Freq: Once | INTRAMUSCULAR | Status: AC
  Administered 2022-08-02: 1 mL via EPIDURAL

## 2022-08-06 ENCOUNTER — Other Ambulatory Visit: Payer: Self-pay | Admitting: Orthopedic Surgery

## 2022-08-16 NOTE — Pre-Procedure Instructions (Signed)
Surgical Instructions    Your procedure is scheduled on Thursday, October 5.  Report to [facility] Main Entrance "A" at 11:00 A.M., then check in with the Admitting office.  Call this number if you have problems the morning of surgery:  [phone]   If you have any questions prior to your surgery date call [phone]: Open Monday-Friday 8am-4pm If you experience any cold or flu symptoms such as cough, fever, chills, shortness of breath, etc. between now and your scheduled surgery, please notify us at the above number     Remember:  Do not eat after midnight the night before your surgery  You may drink clear liquids until 11:00AM the morning of your surgery.   Clear liquids allowed are: Water, Non-Citrus Juices (without pulp), Carbonated Beverages, Clear Tea, Black Coffee ONLY (NO MILK, CREAM OR POWDERED CREAMER of any kind), and Gatorade  Patient Instructions  The night before surgery:  No food after midnight. ONLY clear liquids after midnight  The day of surgery (if you do NOT have diabetes):  Drink ONE (1) Pre-Surgery Clear Ensure by 11:00AM the morning of surgery. Drink in one sitting. Do not sip.  This drink was given to you during your hospital  pre-op appointment visit.  Nothing else to drink after completing the  Pre-Surgery Clear Ensure.      Take these medicines the morning of surgery with A SIP OF WATER:  atenolol (TENORMIN) gabapentin (NEURONTIN)  sertraline (ZOLOFT) ALPRAZolam (XANAX)  if needed cetirizine (ZYRTEC)  if needed HYDROcodone-acetaminophen (NORCO)  if needed Guaifenesin ifneeded  As of today, STOP taking any Aspirin (unless otherwise instructed by your surgeon) Aleve, Naproxen, Ibuprofen, Motrin, Advil, Goody's, BC's, all herbal medications, fish oil, and all vitamins, and meloxicam (Mobic)   [facility] is not responsible for any belongings or valuables.    Do NOT Smoke (Tobacco/Vaping)  24 hours prior to your procedure  If you use a CPAP  at night, you may bring your mask for your overnight stay.   Contacts, glasses, hearing aids, dentures or partials may not be worn into surgery, please bring cases for these belongings   For patients admitted to the hospital, discharge time will be determined by your treatment team.   Patients discharged the day of surgery will not be allowed to drive home, and someone needs to stay with them for 24 hours.   SURGICAL WAITING ROOM VISITATION Patients having surgery or a procedure may have no more than 2 support people in the waiting area - these visitors may rotate.   Children under the age of 15 must have an adult with them who is not the patient. If the patient needs to stay at the hospital during part of their recovery, the visitor guidelines for inpatient rooms apply. Pre-op nurse will coordinate an appropriate time for 1 support person to accompany patient in pre-op.  This support person may not rotate.   Please refer to the [facility] website for the visitor guidelines for Inpatients (after your surgery is over and you are in a regular room).    Special instructions:    Oral Hygiene is also important to reduce your risk of infection.  Remember - BRUSH YOUR TEETH THE MORNING OF SURGERY WITH YOUR REGULAR TOOTHPASTE   [place]- Preparing For Surgery  Before surgery, you can play an important role. Because skin is not sterile, your skin needs to be as free of germs as possible. You can reduce the number of germs on your skin by  washing with CHG (chlorahexidine gluconate) Soap before surgery.  CHG is an antiseptic cleaner which kills germs and bonds with the skin to continue killing germs even after washing.     Please do not use if you have an allergy to CHG or antibacterial soaps. If your skin becomes reddened/irritated stop using the CHG.  Do not shave (including legs and underarms) for at least 48 hours prior to first CHG shower. It is OK to shave your face.  Please follow these  instructions carefully.     Shower the NIGHT BEFORE SURGERY and the MORNING OF SURGERY with CHG Soap.   If you chose to wash your hair, wash your hair first as usual with your normal shampoo. After you shampoo, rinse your hair and body thoroughly to remove the shampoo.  Then [employer] and genitals (private parts) with your normal soap and rinse thoroughly to remove soap.  After that Use CHG Soap as you would any other liquid soap. You can apply CHG directly to the skin and wash gently with a scrungie or a clean washcloth.   Apply the CHG Soap to your body ONLY FROM THE NECK DOWN.  Do not use on open wounds or open sores. Avoid contact with your eyes, ears, mouth and genitals (private parts). Wash Face and genitals (private parts)  with your normal soap.   Wash thoroughly, paying special attention to the area where your surgery will be performed.  Thoroughly rinse your body with warm water from the neck down.  DO NOT shower/wash with your normal soap after using and rinsing off the CHG Soap.  Pat yourself dry with a CLEAN TOWEL.  Wear CLEAN PAJAMAS to bed the night before surgery  Place CLEAN SHEETS on your bed the night before your surgery  DO NOT SLEEP WITH PETS.   Day of Surgery:  Take a shower with CHG soap. Wear Clean/Comfortable clothing the morning of surgery  Do not wear jewelry or makeup. Do not wear lotions, powders, perfumes/cologne or deodorant. Do not shave 48 hours prior to surgery.  Men may shave face and neck. Do not bring valuables to the hospital. Do not wear nail polish, gel polish, artificial nails, or any other type of covering on natural nails (fingers and toes) If you have artificial nails or gel coating that need to be removed by a nail salon, please have this removed prior to surgery. Artificial nails or gel coating may interfere with anesthesia's ability to adequately monitor your vital signs.  Remember to brush your teeth WITH YOUR REGULAR  TOOTHPASTE.    If you received a COVID test during your pre-op visit, it is requested that you wear a mask when out in public, stay away from anyone that may not be feeling well, and notify your surgeon if you develop symptoms. If you have been in contact with anyone that has tested positive in the last 10 days, please notify your surgeon.    Please read over the following fact sheets that you were given.

## 2022-08-17 ENCOUNTER — Encounter (HOSPITAL_COMMUNITY)
Admission: RE | Admit: 2022-08-17 | Discharge: 2022-08-17 | Disposition: A | Discharge status: Home / Self Care | Payer: 59 | Source: Ambulatory Visit | Attending: Orthopedic Surgery | Admitting: Orthopedic Surgery

## 2022-08-17 ENCOUNTER — Other Ambulatory Visit: Payer: Self-pay

## 2022-08-17 ENCOUNTER — Encounter (HOSPITAL_COMMUNITY): Payer: Self-pay

## 2022-08-17 VITALS — BP 150/80 | HR 81 | Temp 97.8°F | Resp 16 | Ht 64.0 in | Wt 140.8 lb

## 2022-08-17 DIAGNOSIS — Z01818 Encounter for other preprocedural examination: Secondary | ICD-10-CM | POA: Insufficient documentation

## 2022-08-17 DIAGNOSIS — M48062 Spinal stenosis, lumbar region with neurogenic claudication: Secondary | ICD-10-CM | POA: Insufficient documentation

## 2022-08-17 LAB — SURGICAL PCR SCREEN
MRSA, PCR: NEGATIVE
Staphylococcus aureus: NEGATIVE

## 2022-08-17 LAB — CBC
HCT: 46.4 % — ABNORMAL HIGH (ref 36.0–46.0)
Hemoglobin: 15.3 g/dL — ABNORMAL HIGH (ref 12.0–15.0)
MCH: 29.3 pg (ref 26.0–34.0)
MCHC: 33 g/dL (ref 30.0–36.0)
MCV: 88.7 fL (ref 80.0–100.0)
Platelets: 261 10*3/uL (ref 150–400)
RBC: 5.23 MIL/uL — ABNORMAL HIGH (ref 3.87–5.11)
RDW: 13.2 % (ref 11.5–15.5)
WBC: 7.2 10*3/uL (ref 4.0–10.5)
nRBC: 0 % (ref 0.0–0.2)

## 2022-08-17 LAB — TYPE AND SCREEN
ABO/RH(D): O POS
Antibody Screen: NEGATIVE

## 2022-08-17 LAB — BASIC METABOLIC PANEL
Anion gap: 11 (ref 5–15)
BUN: 21 mg/dL (ref 8–23)
CO2: 26 mmol/L (ref 22–32)
Calcium: 10 mg/dL (ref 8.9–10.3)
Chloride: 99 mmol/L (ref 98–111)
Creatinine, Ser: 0.78 mg/dL (ref 0.44–1.00)
GFR, Estimated: >60 mL/min (ref >60)
Glucose, Bld: 126 mg/dL — ABNORMAL HIGH (ref 70–99)
Potassium: 3.8 mmol/L (ref 3.5–5.1)
Sodium: 136 mmol/L (ref 135–145)

## 2022-08-17 NOTE — Progress Notes (Signed)
PCP - Wenda Low MD Cardiologist - denies  PPM/ICD - denies  Chest x-ray - n/a EKG - pending Stress Test - denies ECHO - denies Cardiac Cath - denies  Sleep study- denies  No diabetes.  As of today, STOP taking any Aspirin (unless otherwise instructed by your surgeon) Aleve, Naproxen, Ibuprofen, Motrin, Advil, Goody's, BC's, all herbal medications, fish oil, and all vitamins, and meloxicam (Mobic)  ERAS Protcol - yes PRE-SURGERY Ensure or G2- ensure  COVID TEST- n/a  Anesthesia review: no  Patient denies shortness of breath, fever, cough and chest pain at PAT appointment   All instructions explained to the patient, with a verbal understanding of the material. Patient agrees to go over the instructions while at home for a better understanding. The opportunity to ask questions was provided.

## 2022-08-19 ENCOUNTER — Ambulatory Visit (HOSPITAL_COMMUNITY)
Admission: RE | Disposition: A | Discharge status: Home / Self Care | Payer: Self-pay | Source: Home / Self Care | Attending: Orthopedic Surgery

## 2022-08-19 ENCOUNTER — Observation Stay (HOSPITAL_COMMUNITY)
Admission: RE | Admit: 2022-08-19 | Discharge: 2022-08-20 | Disposition: A | Discharge status: Home / Self Care | Payer: 59 | Attending: Orthopedic Surgery | Admitting: Orthopedic Surgery

## 2022-08-19 ENCOUNTER — Ambulatory Visit (HOSPITAL_COMMUNITY): Payer: 59

## 2022-08-19 ENCOUNTER — Other Ambulatory Visit: Payer: Self-pay

## 2022-08-19 ENCOUNTER — Encounter (HOSPITAL_COMMUNITY): Payer: Self-pay | Admitting: Orthopedic Surgery

## 2022-08-19 ENCOUNTER — Ambulatory Visit (HOSPITAL_COMMUNITY): Payer: 59 | Admitting: Certified Registered"

## 2022-08-19 ENCOUNTER — Ambulatory Visit (HOSPITAL_BASED_OUTPATIENT_CLINIC_OR_DEPARTMENT_OTHER): Payer: 59 | Admitting: Certified Registered"

## 2022-08-19 DIAGNOSIS — M5126 Other intervertebral disc displacement, lumbar region: Secondary | ICD-10-CM | POA: Insufficient documentation

## 2022-08-19 DIAGNOSIS — M5116 Intervertebral disc disorders with radiculopathy, lumbar region: Secondary | ICD-10-CM | POA: Diagnosis not present

## 2022-08-19 DIAGNOSIS — M5416 Radiculopathy, lumbar region: Secondary | ICD-10-CM | POA: Diagnosis present

## 2022-08-19 DIAGNOSIS — I10 Essential (primary) hypertension: Secondary | ICD-10-CM | POA: Diagnosis not present

## 2022-08-19 DIAGNOSIS — Z87891 Personal history of nicotine dependence: Secondary | ICD-10-CM | POA: Diagnosis not present

## 2022-08-19 DIAGNOSIS — M5410 Radiculopathy, site unspecified: Secondary | ICD-10-CM | POA: Diagnosis present

## 2022-08-19 DIAGNOSIS — Z79899 Other long term (current) drug therapy: Secondary | ICD-10-CM | POA: Diagnosis not present

## 2022-08-19 DIAGNOSIS — M48062 Spinal stenosis, lumbar region with neurogenic claudication: Secondary | ICD-10-CM

## 2022-08-19 DIAGNOSIS — J45909 Unspecified asthma, uncomplicated: Secondary | ICD-10-CM | POA: Diagnosis not present

## 2022-08-19 HISTORY — PX: TRANSFORAMINAL LUMBAR INTERBODY FUSION (TLIF) WITH PEDICLE SCREW FIXATION 1 LEVEL: SHX6141

## 2022-08-19 LAB — ABO/RH: ABO/RH(D): O POS

## 2022-08-19 SURGERY — TRANSFORAMINAL LUMBAR INTERBODY FUSION (TLIF) WITH PEDICLE SCREW FIXATION 1 LEVEL
Anesthesia: General | Laterality: Right

## 2022-08-19 MED ORDER — FLEET ENEMA 7-19 GM/118ML RE ENEM: 1.0000 | ENEMA | Freq: Once | RECTAL | Status: DC | PRN

## 2022-08-19 MED ORDER — LORATADINE 10 MG PO TABS
10.0000 mg | ORAL_TABLET | Freq: Every day | ORAL | Status: DC
  Administered 2022-08-19: 10 mg via ORAL
  Filled 2022-08-19 (×2): qty 1

## 2022-08-19 MED ORDER — BUPIVACAINE-EPINEPHRINE (PF) 0.25% -1:200000 IJ SOLN
INTRAMUSCULAR | Status: AC
  Filled 2022-08-19: qty 30

## 2022-08-19 MED ORDER — ALBUMIN HUMAN 5 % IV SOLN: INTRAVENOUS | Status: DC | PRN

## 2022-08-19 MED ORDER — ACETAMINOPHEN 10 MG/ML IV SOLN
INTRAVENOUS | Status: AC
  Filled 2022-08-19: qty 100

## 2022-08-19 MED ORDER — SUFENTANIL CITRATE 50 MCG/ML IV SOLN
INTRAVENOUS | Status: AC
  Filled 2022-08-19: qty 1

## 2022-08-19 MED ORDER — SERTRALINE HCL 50 MG PO TABS
50.0000 mg | ORAL_TABLET | Freq: Every day | ORAL | Status: DC
  Administered 2022-08-19: 50 mg via ORAL
  Filled 2022-08-19 (×2): qty 1

## 2022-08-19 MED ORDER — PHENYLEPHRINE 80 MCG/ML (10ML) SYRINGE FOR IV PUSH (FOR BLOOD PRESSURE SUPPORT)
PREFILLED_SYRINGE | INTRAVENOUS | Status: DC | PRN
  Administered 2022-08-19: 160 ug via INTRAVENOUS
  Administered 2022-08-19 (×2): 80 ug via INTRAVENOUS

## 2022-08-19 MED ORDER — DEXAMETHASONE SODIUM PHOSPHATE 10 MG/ML IJ SOLN
INTRAMUSCULAR | Status: AC
  Filled 2022-08-19: qty 2

## 2022-08-19 MED ORDER — BUPIVACAINE-EPINEPHRINE 0.25% -1:200000 IJ SOLN
INTRAMUSCULAR | Status: DC | PRN
  Administered 2022-08-19: 7 mL
  Administered 2022-08-19: 20 mL

## 2022-08-19 MED ORDER — METHOCARBAMOL 1000 MG/10ML IJ SOLN: 500.0000 mg | Freq: Four times a day (QID) | INTRAVENOUS | Status: DC | PRN

## 2022-08-19 MED ORDER — PHENOL 1.4 % MT LIQD: 1.0000 | OROMUCOSAL | Status: DC | PRN

## 2022-08-19 MED ORDER — ZOLPIDEM TARTRATE 5 MG PO TABS
5.0000 mg | ORAL_TABLET | Freq: Every evening | ORAL | Status: DC | PRN
  Filled 2022-08-19: qty 1

## 2022-08-19 MED ORDER — DEXAMETHASONE SODIUM PHOSPHATE 10 MG/ML IJ SOLN
INTRAMUSCULAR | Status: DC | PRN
  Administered 2022-08-19: 10 mg via INTRAVENOUS

## 2022-08-19 MED ORDER — POLYETHYL GLYCOL-PROPYL GLYCOL 0.4-0.3 % OP GEL: Freq: Every day | OPHTHALMIC | Status: DC | PRN

## 2022-08-19 MED ORDER — ESTRADIOL 0.1 MG/GM VA CREA
1.0000 | TOPICAL_CREAM | VAGINAL | Status: DC
  Filled 2022-08-19: qty 42.5

## 2022-08-19 MED ORDER — ONDANSETRON HCL 4 MG/2ML IJ SOLN
INTRAMUSCULAR | Status: AC
  Filled 2022-08-19: qty 2

## 2022-08-19 MED ORDER — CALCIUM CARB-CHOLECALCIFEROL 600-10 MG-MCG PO TABS: ORAL_TABLET | Freq: Every day | ORAL | Status: DC

## 2022-08-19 MED ORDER — ONDANSETRON HCL 4 MG PO TABS: 4.0000 mg | ORAL_TABLET | Freq: Four times a day (QID) | ORAL | Status: DC | PRN

## 2022-08-19 MED ORDER — EPHEDRINE SULFATE-NACL 50-0.9 MG/10ML-% IV SOSY
PREFILLED_SYRINGE | INTRAVENOUS | Status: DC | PRN
  Administered 2022-08-19: 15 mg via INTRAVENOUS

## 2022-08-19 MED ORDER — PHENYLEPHRINE 80 MCG/ML (10ML) SYRINGE FOR IV PUSH (FOR BLOOD PRESSURE SUPPORT)
PREFILLED_SYRINGE | INTRAVENOUS | Status: AC
  Filled 2022-08-19: qty 20

## 2022-08-19 MED ORDER — POVIDONE-IODINE 7.5 % EX SOLN
Freq: Once | CUTANEOUS | Status: DC
  Filled 2022-08-19: qty 118

## 2022-08-19 MED ORDER — SODIUM CHLORIDE 0.9% FLUSH
3.0000 mL | Freq: Two times a day (BID) | INTRAVENOUS | Status: DC
  Administered 2022-08-19: 3 mL via INTRAVENOUS

## 2022-08-19 MED ORDER — MULTI FOR HER 50+ PO TABS: ORAL_TABLET | Freq: Every day | ORAL | Status: DC

## 2022-08-19 MED ORDER — MENTHOL 3 MG MT LOZG: 1.0000 | LOZENGE | OROMUCOSAL | Status: DC | PRN

## 2022-08-19 MED ORDER — ACETAMINOPHEN 325 MG PO TABS: 650.0000 mg | ORAL_TABLET | ORAL | Status: DC | PRN

## 2022-08-19 MED ORDER — ONDANSETRON HCL 4 MG/2ML IJ SOLN
INTRAMUSCULAR | Status: DC | PRN
  Administered 2022-08-19: 4 mg via INTRAVENOUS

## 2022-08-19 MED ORDER — CEFAZOLIN SODIUM-DEXTROSE 2-4 GM/100ML-% IV SOLN
2.0000 g | INTRAVENOUS | Status: AC
  Administered 2022-08-19: 2 g via INTRAVENOUS
  Filled 2022-08-19: qty 100

## 2022-08-19 MED ORDER — ONDANSETRON HCL 4 MG/2ML IJ SOLN: 4.0000 mg | Freq: Four times a day (QID) | INTRAMUSCULAR | Status: DC | PRN

## 2022-08-19 MED ORDER — OXYCODONE-ACETAMINOPHEN 5-325 MG PO TABS
1.0000 | ORAL_TABLET | ORAL | Status: DC | PRN
  Administered 2022-08-19: 1 via ORAL
  Administered 2022-08-20 (×2): 2 via ORAL
  Filled 2022-08-19 (×3): qty 2

## 2022-08-19 MED ORDER — BISACODYL 5 MG PO TBEC: 5.0000 mg | DELAYED_RELEASE_TABLET | Freq: Every day | ORAL | Status: DC | PRN

## 2022-08-19 MED ORDER — ROCURONIUM BROMIDE 10 MG/ML (PF) SYRINGE
PREFILLED_SYRINGE | INTRAVENOUS | Status: DC | PRN
  Administered 2022-08-19: 50 mg via INTRAVENOUS
  Administered 2022-08-19 (×2): 20 mg via INTRAVENOUS

## 2022-08-19 MED ORDER — THROMBIN 20000 UNITS EX SOLR
CUTANEOUS | Status: DC | PRN
  Administered 2022-08-19: 20 mL via TOPICAL

## 2022-08-19 MED ORDER — METHOCARBAMOL 500 MG PO TABS
500.0000 mg | ORAL_TABLET | Freq: Four times a day (QID) | ORAL | Status: DC | PRN
  Administered 2022-08-19 – 2022-08-20 (×3): 500 mg via ORAL
  Filled 2022-08-19 (×3): qty 1

## 2022-08-19 MED ORDER — ADULT MULTIVITAMIN W/MINERALS CH
1.0000 | ORAL_TABLET | Freq: Every day | ORAL | Status: DC
  Administered 2022-08-19 – 2022-08-20 (×2): 1 via ORAL
  Filled 2022-08-19 (×2): qty 1

## 2022-08-19 MED ORDER — CHLORHEXIDINE GLUCONATE 0.12 % MT SOLN
15.0000 mL | Freq: Once | OROMUCOSAL | Status: AC
  Administered 2022-08-19: 15 mL via OROMUCOSAL
  Filled 2022-08-19: qty 15

## 2022-08-19 MED ORDER — SODIUM CHLORIDE 0.9 % IV SOLN: 250.0000 mL | INTRAVENOUS | Status: DC

## 2022-08-19 MED ORDER — LIDOCAINE 2% (20 MG/ML) 5 ML SYRINGE
INTRAMUSCULAR | Status: AC
  Filled 2022-08-19: qty 5

## 2022-08-19 MED ORDER — GABAPENTIN 600 MG PO TABS
600.0000 mg | ORAL_TABLET | Freq: Three times a day (TID) | ORAL | Status: DC
  Administered 2022-08-19 – 2022-08-20 (×2): 600 mg via ORAL
  Filled 2022-08-19 (×2): qty 1

## 2022-08-19 MED ORDER — MIDAZOLAM HCL 2 MG/2ML IJ SOLN
INTRAMUSCULAR | Status: DC | PRN
  Administered 2022-08-19: 2 mg via INTRAVENOUS

## 2022-08-19 MED ORDER — DOCUSATE SODIUM 100 MG PO CAPS
100.0000 mg | ORAL_CAPSULE | Freq: Two times a day (BID) | ORAL | Status: DC
  Administered 2022-08-19 – 2022-08-20 (×2): 100 mg via ORAL
  Filled 2022-08-19 (×2): qty 1

## 2022-08-19 MED ORDER — SENNOSIDES-DOCUSATE SODIUM 8.6-50 MG PO TABS
1.0000 | ORAL_TABLET | Freq: Every evening | ORAL | Status: DC | PRN
  Administered 2022-08-19: 1 via ORAL
  Filled 2022-08-19: qty 1

## 2022-08-19 MED ORDER — THROMBIN (RECOMBINANT) 20000 UNITS EX SOLR
CUTANEOUS | Status: AC
  Filled 2022-08-19: qty 20000

## 2022-08-19 MED ORDER — LIDOCAINE 2% (20 MG/ML) 5 ML SYRINGE
INTRAMUSCULAR | Status: DC | PRN
  Administered 2022-08-19: 60 mg via INTRAVENOUS

## 2022-08-19 MED ORDER — ATENOLOL 25 MG PO TABS
25.0000 mg | ORAL_TABLET | Freq: Every evening | ORAL | Status: DC
  Administered 2022-08-19: 25 mg via ORAL
  Filled 2022-08-19: qty 1

## 2022-08-19 MED ORDER — POLYVINYL ALCOHOL 1.4 % OP SOLN: 1.0000 [drp] | OPHTHALMIC | Status: DC | PRN

## 2022-08-19 MED ORDER — ALUM & MAG HYDROXIDE-SIMETH 200-200-20 MG/5ML PO SUSP: 30.0000 mL | Freq: Four times a day (QID) | ORAL | Status: DC | PRN

## 2022-08-19 MED ORDER — SUGAMMADEX SODIUM 200 MG/2ML IV SOLN
INTRAVENOUS | Status: DC | PRN
  Administered 2022-08-19: 200 mg via INTRAVENOUS

## 2022-08-19 MED ORDER — PHENYLEPHRINE HCL-NACL 20-0.9 MG/250ML-% IV SOLN
INTRAVENOUS | Status: DC | PRN
  Administered 2022-08-19: 30 ug/min via INTRAVENOUS

## 2022-08-19 MED ORDER — SUFENTANIL CITRATE 50 MCG/ML IV SOLN
INTRAVENOUS | Status: DC | PRN
  Administered 2022-08-19: 5 ug via INTRAVENOUS
  Administered 2022-08-19 (×2): 10 ug via INTRAVENOUS
  Administered 2022-08-19 (×3): 5 ug via INTRAVENOUS

## 2022-08-19 MED ORDER — PROPOFOL 10 MG/ML IV BOLUS
INTRAVENOUS | Status: DC | PRN
  Administered 2022-08-19: 150 mg via INTRAVENOUS

## 2022-08-19 MED ORDER — ROCURONIUM BROMIDE 10 MG/ML (PF) SYRINGE
PREFILLED_SYRINGE | INTRAVENOUS | Status: AC
  Filled 2022-08-19: qty 10

## 2022-08-19 MED ORDER — BUPIVACAINE LIPOSOME 1.3 % IJ SUSP
INTRAMUSCULAR | Status: DC | PRN
  Administered 2022-08-19: 20 mL

## 2022-08-19 MED ORDER — POTASSIUM CHLORIDE IN NACL 20-0.9 MEQ/L-% IV SOLN: INTRAVENOUS | Status: DC

## 2022-08-19 MED ORDER — ALPRAZOLAM 0.25 MG PO TABS: 0.2500 mg | ORAL_TABLET | Freq: Every evening | ORAL | Status: DC | PRN

## 2022-08-19 MED ORDER — FENTANYL CITRATE (PF) 100 MCG/2ML IJ SOLN
INTRAMUSCULAR | Status: AC
  Filled 2022-08-19: qty 2

## 2022-08-19 MED ORDER — FENTANYL CITRATE (PF) 100 MCG/2ML IJ SOLN
25.0000 ug | INTRAMUSCULAR | Status: DC | PRN
  Administered 2022-08-19: 50 ug via INTRAVENOUS

## 2022-08-19 MED ORDER — SODIUM CHLORIDE 0.9% FLUSH: 3.0000 mL | INTRAVENOUS | Status: DC | PRN

## 2022-08-19 MED ORDER — CEFAZOLIN SODIUM-DEXTROSE 2-4 GM/100ML-% IV SOLN
2.0000 g | Freq: Three times a day (TID) | INTRAVENOUS | Status: AC
  Administered 2022-08-19 – 2022-08-20 (×2): 2 g via INTRAVENOUS
  Filled 2022-08-19 (×2): qty 100

## 2022-08-19 MED ORDER — MORPHINE SULFATE (PF) 2 MG/ML IV SOLN: 1.0000 mg | INTRAVENOUS | Status: DC | PRN

## 2022-08-19 MED ORDER — LACTATED RINGERS IV SOLN: INTRAVENOUS | Status: DC

## 2022-08-19 MED ORDER — MIDAZOLAM HCL 2 MG/2ML IJ SOLN
INTRAMUSCULAR | Status: AC
  Filled 2022-08-19: qty 2

## 2022-08-19 MED ORDER — OYSTER SHELL CALCIUM/D3 500-5 MG-MCG PO TABS
1.0000 | ORAL_TABLET | Freq: Every day | ORAL | Status: DC
  Administered 2022-08-20: 1 via ORAL
  Filled 2022-08-19: qty 1

## 2022-08-19 MED ORDER — AMISULPRIDE (ANTIEMETIC) 5 MG/2ML IV SOLN: 10.0000 mg | Freq: Once | INTRAVENOUS | Status: DC | PRN

## 2022-08-19 MED ORDER — ACETAMINOPHEN 650 MG RE SUPP: 650.0000 mg | RECTAL | Status: DC | PRN

## 2022-08-19 MED ORDER — ACETAMINOPHEN 10 MG/ML IV SOLN
INTRAVENOUS | Status: DC | PRN
  Administered 2022-08-19: 1000 mg via INTRAVENOUS

## 2022-08-19 MED ORDER — PHENYLEPHRINE 80 MCG/ML (10ML) SYRINGE FOR IV PUSH (FOR BLOOD PRESSURE SUPPORT)
PREFILLED_SYRINGE | INTRAVENOUS | Status: AC
  Filled 2022-08-19: qty 10

## 2022-08-19 MED ORDER — BUPIVACAINE LIPOSOME 1.3 % IJ SUSP
INTRAMUSCULAR | Status: AC
  Filled 2022-08-19: qty 20

## 2022-08-19 MED ORDER — PROPOFOL 10 MG/ML IV BOLUS
INTRAVENOUS | Status: AC
  Filled 2022-08-19: qty 20

## 2022-08-19 MED ORDER — 0.9 % SODIUM CHLORIDE (POUR BTL) OPTIME
TOPICAL | Status: DC | PRN
  Administered 2022-08-19: 1000 mL

## 2022-08-19 MED ORDER — ORAL CARE MOUTH RINSE: 15.0000 mL | Freq: Once | OROMUCOSAL | Status: AC

## 2022-08-19 SURGICAL SUPPLY — 82 items
APL SKNCLS STERI-STRIP NONHPOA (GAUZE/BANDAGES/DRESSINGS) ×1
BAG COUNTER SPONGE SURGICOUNT (BAG) ×1 IMPLANT
BAG SPNG CNTER NS LX DISP (BAG) ×1
BENZOIN TINCTURE PRP APPL 2/3 (GAUZE/BANDAGES/DRESSINGS) ×1 IMPLANT
BUR PRESCISION 1.7 ELITE (BURR) ×1 IMPLANT
BUR ROUND FLUTED 5 RND (BURR) ×1 IMPLANT
CAGE SABLE 10X26 6-12 8D (Cage) IMPLANT
CANNULA GRAFT BNE VG PRE-FILL (Bone Implant) IMPLANT
CLSR STERI-STRIP ANTIMIC 1/2X4 (GAUZE/BANDAGES/DRESSINGS) IMPLANT
CNTNR URN SCR LID CUP LEK RST (MISCELLANEOUS) ×1 IMPLANT
CONT SPEC 4OZ STRL OR WHT (MISCELLANEOUS) ×1
COVER BACK TABLE 60X90IN (DRAPES) ×1 IMPLANT
COVER MAYO STAND STRL (DRAPES) ×2 IMPLANT
COVER SURGICAL LIGHT HANDLE (MISCELLANEOUS) ×1 IMPLANT
DISPENSER GRAFT BNE VG (MISCELLANEOUS) IMPLANT
DISPENSER VIVIGEN BONE GRAFT (MISCELLANEOUS) ×1 IMPLANT
DRAPE C-ARM 42X72 X-RAY (DRAPES) ×1 IMPLANT
DRAPE POUCH INSTRU U-SHP 10X18 (DRAPES) ×1 IMPLANT
DRAPE SURG 17X23 STRL (DRAPES) ×4 IMPLANT
DURAPREP 26ML APPLICATOR (WOUND CARE) ×1 IMPLANT
ELECT BLADE 4.0 EZ CLEAN MEGAD (MISCELLANEOUS) ×1
ELECT CAUTERY BLADE 6.4 (BLADE) ×1 IMPLANT
ELECT REM PT RETURN 9FT ADLT (ELECTROSURGICAL) ×1
ELECTRODE BLDE 4.0 EZ CLN MEGD (MISCELLANEOUS) ×1 IMPLANT
ELECTRODE REM PT RTRN 9FT ADLT (ELECTROSURGICAL) ×1 IMPLANT
FILTER STRAW FLUID ASPIR (MISCELLANEOUS) ×1 IMPLANT
GAUZE 4X4 16PLY ~~LOC~~+RFID DBL (SPONGE) ×1 IMPLANT
GAUZE SPONGE 4X4 12PLY STRL (GAUZE/BANDAGES/DRESSINGS) ×1 IMPLANT
GLOVE BIO SURGEON STRL SZ7 (GLOVE) ×1 IMPLANT
GLOVE BIO SURGEON STRL SZ8 (GLOVE) ×1 IMPLANT
GLOVE BIOGEL PI IND STRL 7.0 (GLOVE) ×1 IMPLANT
GLOVE BIOGEL PI IND STRL 8 (GLOVE) ×1 IMPLANT
GLOVE SURG ENC MOIS LTX SZ6.5 (GLOVE) ×1 IMPLANT
GOWN STRL REUS W/ TWL LRG LVL3 (GOWN DISPOSABLE) ×2 IMPLANT
GOWN STRL REUS W/ TWL XL LVL3 (GOWN DISPOSABLE) ×1 IMPLANT
GOWN STRL REUS W/TWL LRG LVL3 (GOWN DISPOSABLE) ×2
GOWN STRL REUS W/TWL XL LVL3 (GOWN DISPOSABLE) ×1
GRAFT BONE CANNULA VIVIGEN 3 (Bone Implant) ×5 IMPLANT
Graft Bone Cannula Vivigen 3 IMPLANT
IV CATH 14GX2 1/4 (CATHETERS) ×1 IMPLANT
KIT BASIN OR (CUSTOM PROCEDURE TRAY) ×1 IMPLANT
KIT POSITION SURG JACKSON T1 (MISCELLANEOUS) ×1 IMPLANT
KIT TURNOVER KIT B (KITS) ×1 IMPLANT
MARKER SKIN DUAL TIP RULER LAB (MISCELLANEOUS) ×2 IMPLANT
NDL 18GX1X1/2 (RX/OR ONLY) (NEEDLE) ×1 IMPLANT
NDL 22X1.5 STRL (OR ONLY) (MISCELLANEOUS) ×2 IMPLANT
NDL HYPO 25GX1X1/2 BEV (NEEDLE) ×1 IMPLANT
NDL SPNL 18GX3.5 QUINCKE PK (NEEDLE) ×2 IMPLANT
NEEDLE 18GX1X1/2 (RX/OR ONLY) (NEEDLE) ×1 IMPLANT
NEEDLE 22X1.5 STRL (OR ONLY) (MISCELLANEOUS) ×2 IMPLANT
NEEDLE HYPO 25GX1X1/2 BEV (NEEDLE) ×1 IMPLANT
NEEDLE SPNL 18GX3.5 QUINCKE PK (NEEDLE) ×2 IMPLANT
NS IRRIG 1000ML POUR BTL (IV SOLUTION) ×1 IMPLANT
PACK LAMINECTOMY ORTHO (CUSTOM PROCEDURE TRAY) ×1 IMPLANT
PACK UNIVERSAL I (CUSTOM PROCEDURE TRAY) ×1 IMPLANT
PAD ARMBOARD 7.5X6 YLW CONV (MISCELLANEOUS) ×2 IMPLANT
PATTIES SURGICAL .5 X1 (DISPOSABLE) ×1 IMPLANT
PATTIES SURGICAL .5X1.5 (GAUZE/BANDAGES/DRESSINGS) ×1 IMPLANT
PENCIL BUTTON HOLSTER BLD 10FT (ELECTRODE) IMPLANT
ROD PRE BENT EXP 40MM (Rod) IMPLANT
SCREW SET SINGLE INNER (Screw) IMPLANT
SCREW VIPER CORT FIX 6.00X30 (Screw) IMPLANT
SPONGE INTESTINAL PEANUT (DISPOSABLE) ×1 IMPLANT
SPONGE SURGIFOAM ABS GEL 100 (HEMOSTASIS) ×1 IMPLANT
STRIP CLOSURE SKIN 1/2X4 (GAUZE/BANDAGES/DRESSINGS) ×2 IMPLANT
SUT MNCRL AB 4-0 PS2 18 (SUTURE) ×1 IMPLANT
SUT VIC AB 0 CT1 18XCR BRD 8 (SUTURE) ×1 IMPLANT
SUT VIC AB 0 CT1 8-18 (SUTURE) ×1
SUT VIC AB 1 CT1 18XCR BRD 8 (SUTURE) ×1 IMPLANT
SUT VIC AB 1 CT1 8-18 (SUTURE) ×1
SUT VIC AB 2-0 CT2 18 VCP726D (SUTURE) ×1 IMPLANT
SYR 20ML LL LF (SYRINGE) ×2 IMPLANT
SYR BULB IRRIG 60ML STRL (SYRINGE) ×1 IMPLANT
SYR CONTROL 10ML LL (SYRINGE) ×2 IMPLANT
SYR TB 1ML LUER SLIP (SYRINGE) ×1 IMPLANT
TAP EXPEDIUM DL 4.35 (INSTRUMENTS) IMPLANT
TAP EXPEDIUM DL 5.0 (INSTRUMENTS) IMPLANT
TAP EXPEDIUM DL 6.0 (INSTRUMENTS) IMPLANT
TAPE CLOTH SURG 4X10 WHT LF (GAUZE/BANDAGES/DRESSINGS) IMPLANT
TRAY FOLEY MTR SLVR 16FR STAT (SET/KITS/TRAYS/PACK) ×1 IMPLANT
WATER STERILE IRR 1000ML POUR (IV SOLUTION) ×1 IMPLANT
YANKAUER SUCT BULB TIP NO VENT (SUCTIONS) ×1 IMPLANT

## 2022-08-19 NOTE — Op Note (Signed)
PATIENT NAME: Laurie Mcclain   MEDICAL RECORD NO.:   [id number]   DATE OF BIRTH: 1960/12/05   DATE OF PROCEDURE: 08/19/2022                               OPERATIVE REPORT     PREOPERATIVE DIAGNOSES: 1. Right-sided L5 radiculopathy secondary to a large recurrent right-sided L4-5 disc herniation 2. Status post previous decompression, L4-5   POSTOPERATIVE DIAGNOSES: 1. Right-sided L5 radiculopathy secondary to a large recurrent right-sided L4-5 disc herniation 2. Status post previous decompression, L4-5   PROCEDURES: 1. Revision L4/5 decompression 2. Right-sided L4-5 transforaminal lumbar interbody fusion. 3. Left-sided L4-5 posterolateral fusion. 4. Insertion of interbody device x1 (Globus expandable intervertebral spacer). 5. Placement of segmental posterior instrumentation L4, L5 bilaterally  6. Use of local autograft. 7. Use of morselized allograft - Vivigen 8. Intraoperative use of fluoroscopy.   SURGEON:  Phylliss Bob, MD.   ASSISTANTPricilla Holm, PA-C.   ANESTHESIA:  General endotracheal anesthesia.   COMPLICATIONS:  None.   DISPOSITION:  Stable.   ESTIMATED BLOOD LOSS:  100cc   INDICATIONS FOR SURGERY:  Briefly, Ms. Kindig is a pleasant 61 year old female who did present to me with severe and ongoing pain in the right leg.  As noted above, she is status post a decompression at both L4-5 and L5-S1.  More recently, she did have a very severe recurrence of right leg pain, and an MRI did reveal a large recurrent L4-5 disc herniation, severely compressing the traversing right L5 nerve.  She unfortunately gained no response with conservative treatment, including an epidural injection.  We did extensively discussed additional nonoperative measures as well as surgical intervention as outlined above, and the patient did ultimately elect to proceed with surgery, after a full discussion with regards to the risks and limitations and recovery period associated with surgery.     OPERATIVE DETAILS:  On 08/19/2022, the patient was brought to surgery and general endotracheal anesthesia was administered.  The patient was placed prone on a well-padded flat Jackson bed with a spinal frame.  Antibiotics were given and a time-out procedure was performed. The back was prepped and draped in the usual fashion.  A midline incision was made overlying the L4-5 intervertebral spaces.  The fascia was incised at the midline.  The paraspinal musculature was bluntly swept laterally.  Anatomic landmarks for the pedicles were exposed. Using fluoroscopy, I did cannulate the L4 and L5 pedicles bilaterally, using a medial to lateral cortical trajectory technique.  At this point, 6 x 30 mm screws were placed into the left pedicles, and a 40 mm rod was placed into the tulip heads of the screws, and caps were also placed.  Distraction was then applied across the L4-5 intervertebral space, and the caps were then provisionally tightened.  On the right side, bone wax was placed into the cannulated pedicle holes.  I then proceeded with the decompressive aspect of the procedure at the L4-5 level.  Of note, this was a very meticulous portion of the procedure, as there was noted to be abundant scar tissue circumferentially about the dura posteriorly at L4-5, given the previous decompression.  Ultimately, a plane was developed between the dura and the right L4-5 facet joint. A partial facetectomy was performed on the right at L4-5, after which point the plane between the traversing right L5 nerve and disc herniation ventral to it was established.  The right  L5 nerve was then gently mobilized medially. With an assistant holding medial retraction of the traversing right L5 nerve, a large disc herniation was readily noted, and was removed in its entirety.  With ongoing gentle medial retraction of the right L5 nerve, I did perform an annulotomy at the posterolateral aspect of the L4-5 intervertebral space.  I then  used a series of curettes and pituitary rongeurs to perform a thorough and complete intervertebral diskectomy.  The intervertebral space was then liberally packed with autograft as well as allograft in the form of Vivigen, as was the appropriate-sized intervertebral spacer.  The spacer was then tamped into position in the usual fashion, and expanded to approximately 10.5 mm in height.  I was very pleased with the press-fit of the spacer.  I then placed 6 mm screws on the right at L4 and L5. A 40-mm rod was then placed and caps were placed. The distraction was then released on the contralateral side.  All caps were then locked.  The wound was copiously irrigated with a total of approximately 3 L prior to placing the bone graft.  Additional autograft and allograft was then packed into the posterolateral gutter on the left side, to help aid in the success of the fusion.  The wound was  explored for any undue bleeding and there was no substantial bleeding encountered.  Gel-Foam was placed over the laminectomy site.  The wound was then closed in layers using #1 Vicryl followed by 2-0 Vicryl, followed by 4-0 Monocryl.  Benzoin and Steri-Strips were applied followed by sterile dressing.     Of note, Jason Coop was my assistant throughout surgery, and did aid in retraction, suctioning, placement of the hardware, and closure.       Estill Bamberg, MD

## 2022-08-19 NOTE — Transfer of Care (Signed)
Immediate Anesthesia Transfer of Care Note  Patient: SHIQUITA COLLIGNON  Procedure(s) Performed: RIGHT-SIDED LUMBAR 4 - LUMBAR 5 TRANSFORAMINAL LUMBAR INTERBODY FUSION AND DECOMPRESSION WITH INSTRUMENTATION AND ALLOGRAFT (Right)  Patient Location: PACU  Anesthesia Type:General  Level of Consciousness: awake, alert  and oriented  Airway & Oxygen Therapy: Patient Spontanous Breathing and Patient connected to nasal cannula oxygen  Post-op Assessment: Report given to RN and Post -op Vital signs reviewed and stable  Post vital signs: Reviewed and stable  Last Vitals:  Vitals Value Taken Time  BP 129/69   Temp    Pulse 100 08/19/22 1658  Resp 12 08/19/22 1658  SpO2 99 % 08/19/22 1658  Vitals shown include unvalidated device data.  Last Pain:  Vitals:   08/19/22 1151  TempSrc:   PainSc: 3       Patients Stated Pain Goal: 0 (32/95/18 8416)  Complications: No notable events documented.

## 2022-08-19 NOTE — H&P (Signed)
PREOPERATIVE H&P  Chief Complaint: Right leg pain  HPI: Laurie Mcclain is a 61 y.o. female who presents with ongoing severe pain in the right leg  MRI reveals a large recurrent right-sided L4-5 disc herniation  Patient has failed multiple forms of conservative care and continues to have pain (see office notes for additional details regarding the patient's full course of treatment)  Past Medical History:  Diagnosis Date   Anemia    hx with pregnancy   Anxiety    Asthma    hx as child   Degenerative disc disease    Depression    Headache    sinus headaches   Hypertension    SVT (supraventricular tachycardia)    Tinnitus    Past Surgical History:  Procedure Laterality Date   CESAREAN SECTION     KNEE SURGERY     laser surgery on left retina     also had preventive laser eye surgery on left eye   LUMBAR LAMINECTOMY/DECOMPRESSION MICRODISCECTOMY Left 04/16/2015   Procedure: CENTRAL DECOMPRESSION L4 - L5/MICRODISCECTOMY AND HEMI-LAMINECTOMY L5 - S1 ON THE LEFT  2 LEVELS;  Surgeon: Ranee Gosselin, MD;  Location: [facility];  Service: Orthopedics;  Laterality: Left;   WISDOM TOOTH EXTRACTION  2016   Social History   Socioeconomic History   Marital status: Married    Spouse name: Thayer Ohm   Number of children: 3   Years of education: Not on file   Highest education level: Not on file  Occupational History   Not on file  Tobacco Use   Smoking status: Former   Smokeless tobacco: Never  Building services engineer Use: Never used  Substance and Sexual Activity   Alcohol use: Not Currently   Drug use: No   Sexual activity: Yes  Other Topics Concern   Not on file  Social History Narrative   Not on file   Social Determinants of Health   Financial Resource Strain: Not on file  Food Insecurity: Not on file  Transportation Needs: Not on file  Physical Activity: Not on file  Stress: Not on file  Social Connections: Not on file   Family History  Problem Relation Age of Onset    Heart failure Mother    Diabetes Mother    Hypertension Mother    Diabetes Father    Allergies  Allergen Reactions   Erythromycin Other (See Comments)    GI- Upset   Other Other (See Comments)    Anti toxin tetanus Childhood allergy test result   Prior to Admission medications   Medication Sig Start Date End Date Taking? Authorizing Provider  ALPRAZolam Prudy Feeler) 0.25 MG tablet Take 0.25 mg by mouth at bedtime as needed for anxiety or sleep.    Yes [provider]  atenolol (TENORMIN) 25 MG tablet Take 25 mg by mouth every evening.    Yes [provider]  Calcium Carb-Cholecalciferol (CALCIUM + VITAMIN D3 PO) Take 1 tablet by mouth daily. 1200 mg / 25 mg   Yes [provider]  cetirizine (ZYRTEC) 10 MG tablet Take 10 mg by mouth daily as needed for allergies.   Yes [provider]  clobetasol ointment (TEMOVATE) 0.05 % Apply 1 Application topically 2 (two) times daily. 08/04/22  Yes [provider]  estradiol (ESTRACE) 0.1 MG/GM vaginal cream Place 1 Applicatorful vaginally 2 (two) times a week. 04/09/22  Yes [provider]  fish oil-omega-3 fatty acids 1000 MG capsule Take 1,200 mg by  mouth at bedtime. 360 mg Omega 3   Yes [provider]  gabapentin (NEURONTIN) 600 MG tablet Take 600 mg by mouth 3 (three) times daily. 08/06/22  Yes [provider]  Guaifenesin 1200 MG TB12 Take 1,200 mg by mouth daily as needed for to loosen phlegm.   Yes [provider]  HYDROcodone-acetaminophen (NORCO) 7.5-325 MG tablet Take 1 tablet by mouth every 6 (six) hours as needed for moderate pain or severe pain. 08/07/22  Yes [provider]  meloxicam (MOBIC) 15 MG tablet Take 15 mg by mouth daily. 08/01/22  Yes [provider]  Multiple Vitamins-Minerals (MULTI FOR HER 50+ PO) Take 1 tablet by mouth at bedtime.   Yes [provider]  Polyethyl Glycol-Propyl Glycol (SYSTANE OP) Apply 1-2 drops to eye  daily as needed (dry eyes.).   Yes [provider]  sertraline (ZOLOFT) 50 MG tablet Take 50 tablets by mouth daily. 02/02/15  Yes [provider]  lidocaine (LIDODERM) 5 % Place 1 patch onto the skin daily. Remove & Discard patch within 12 hours or as directed by MD Patient not taking: Reported on 08/10/2022 07/14/22   Sherwood Gambler, MD  methocarbamol (ROBAXIN) 500 MG tablet Take 1 tablet (500 mg total) by mouth every 8 (eight) hours as needed for muscle spasms. Patient not taking: Reported on 08/10/2022 07/14/22   Sherwood Gambler, MD     All other systems have been reviewed and were otherwise negative with the exception of those mentioned in the HPI and as above.  Physical Exam: There were no vitals filed for this visit.  There is no height or weight on file to calculate BMI.  General: Alert, no acute distress Cardiovascular: No pedal edema Respiratory: No cyanosis, no use of accessory musculature Skin: No lesions in the area of chief complaint Neurologic: Sensation intact distally Psychiatric: Patient is competent for consent with normal mood and affect Lymphatic: No axillary or cervical lymphadenopathy  MUSCULOSKELETAL: + Straight leg raise on the right  Assessment/Plan: Severe right L5 radiculopathy 4 weeks.  This is resulting in severe constant pain in her right leg, as well as weakness. Plan for Procedure(s): RIGHT-SIDED LUMBAR 4 - LUMBAR 5 TRANSFORAMINAL LUMBAR INTERBODY FUSION AND DECOMPRESSION WITH INSTRUMENTATION AND ALLOGRAFT   Norva Karvonen, MD 08/19/2022 7:39 AM

## 2022-08-19 NOTE — Anesthesia Procedure Notes (Signed)
Procedure Name: Intubation Date/Time: 08/19/2022 1:05 PM  Performed by: Anastasio Auerbach, CRNAPre-anesthesia Checklist: Patient identified, Emergency Drugs available, Suction available and Patient being monitored Patient Re-evaluated:Patient Re-evaluated prior to induction Oxygen Delivery Method: Circle system utilized Preoxygenation: Pre-oxygenation with 100% oxygen Induction Type: IV induction Ventilation: Mask ventilation without difficulty Laryngoscope Size: 3 and Mac Grade View: Grade I Tube type: Oral Tube size: 7.5 mm Number of attempts: 1 Airway Equipment and Method: Stylet and Oral airway Placement Confirmation: ETT inserted through vocal cords under direct vision, positive ETCO2 and breath sounds checked- equal and bilateral Tube secured with: Tape Dental Injury: Teeth and Oropharynx as per pre-operative assessment

## 2022-08-19 NOTE — Anesthesia Postprocedure Evaluation (Signed)
Anesthesia Post Note  Patient: Laurie Mcclain  Procedure(s) Performed: RIGHT-SIDED LUMBAR 4 - LUMBAR 5 TRANSFORAMINAL LUMBAR INTERBODY FUSION AND DECOMPRESSION WITH INSTRUMENTATION AND ALLOGRAFT (Right)     Patient location during evaluation: PACU Anesthesia Type: General Level of consciousness: awake and alert Pain management: pain level controlled Vital Signs Assessment: post-procedure vital signs reviewed and stable Respiratory status: spontaneous breathing, nonlabored ventilation and respiratory function stable Cardiovascular status: blood pressure returned to baseline and stable Postop Assessment: no apparent nausea or vomiting Anesthetic complications: no   No notable events documented.  Last Vitals:  Vitals:   08/19/22 1730 08/19/22 1745  BP: 100/70 (!) 118/53  Pulse: 91 86  Resp: (!) 9 13  Temp:    SpO2: 97% 92%    Last Pain:  Vitals:   08/19/22 1745  TempSrc:   PainSc: Asleep                 Nazifa Trinka,W. EDMOND

## 2022-08-19 NOTE — Anesthesia Preprocedure Evaluation (Signed)
Anesthesia Evaluation  Patient identified by MRN, date of birth, ID band Patient awake    Reviewed: Allergy & Precautions, NPO status , Patient's Chart, lab work & pertinent test results  Airway Mallampati: II  TM Distance: >3 FB     Dental  (+) Dental Advisory Given   Pulmonary asthma , former smoker,    breath sounds clear to auscultation       Cardiovascular hypertension, Pt. on medications + dysrhythmias Supra Ventricular Tachycardia  Rhythm:Regular Rate:Normal     Neuro/Psych  Headaches,    GI/Hepatic negative GI ROS, Neg liver ROS,   Endo/Other  negative endocrine ROS  Renal/GU negative Renal ROS     Musculoskeletal  (+) Arthritis ,   Abdominal   Peds  Hematology negative hematology ROS (+)   Anesthesia Other Findings   Reproductive/Obstetrics                             Lab Results  Component Value Date   WBC 7.2 08/17/2022   HGB 15.3 (H) 08/17/2022   HCT 46.4 (H) 08/17/2022   MCV 88.7 08/17/2022   PLT 261 08/17/2022   Lab Results  Component Value Date   CREATININE 0.78 08/17/2022   BUN 21 08/17/2022   NA 136 08/17/2022   K 3.8 08/17/2022   CL 99 08/17/2022   CO2 26 08/17/2022    Anesthesia Physical Anesthesia Plan  ASA: 2  Anesthesia Plan: General   Post-op Pain Management: Tylenol PO (pre-op)* and Toradol IV (intra-op)*   Induction: Intravenous  PONV Risk Score and Plan: 3 and Dexamethasone, Ondansetron and Treatment may vary due to age or medical condition  Airway Management Planned: Oral ETT  Additional Equipment: None  Intra-op Plan:   Post-operative Plan: Extubation in OR  Informed Consent: I have reviewed the patients History and Physical, chart, labs and discussed the procedure including the risks, benefits and alternatives for the proposed anesthesia with the patient or authorized representative who has indicated his/her understanding and  acceptance.     Dental advisory given  Plan Discussed with: CRNA  Anesthesia Plan Comments:         Anesthesia Quick Evaluation

## 2022-08-20 DIAGNOSIS — M5416 Radiculopathy, lumbar region: Secondary | ICD-10-CM | POA: Diagnosis not present

## 2022-08-20 MED ORDER — OXYCODONE-ACETAMINOPHEN 5-325 MG PO TABS: 1.0000 | ORAL_TABLET | ORAL | 0 refills | Status: AC | PRN

## 2022-08-20 MED ORDER — SERTRALINE HCL 50 MG PO TABS: 50.0000 mg | ORAL_TABLET | Freq: Every day | ORAL | 0 refills | Status: AC

## 2022-08-20 MED ORDER — METHOCARBAMOL 500 MG PO TABS: 500.0000 mg | ORAL_TABLET | Freq: Four times a day (QID) | ORAL | 2 refills | Status: AC | PRN

## 2022-08-20 MED FILL — Thrombin (Recombinant) For Soln 20000 Unit: CUTANEOUS | Qty: 1 | Status: AC

## 2022-08-20 NOTE — Progress Notes (Signed)
Pt discharged home. Pt's PIV removed and all room belongings packed. Pt was educated on all discharge instructions and has no questions at this time.  Justice Rocher, RN

## 2022-08-20 NOTE — Progress Notes (Signed)
    Patient doing well  Denies leg pain   Physical Exam: Vitals:   08/19/22 1945 08/19/22 2341  BP: (!) 141/72 (!) 110/58  Pulse:    Resp: 20 20  Temp: 98.3 F (36.8 C) 97.9 F (36.6 C)  SpO2:      Dressing in place NVI  POD #1 s/p L4/5 decompression and fusion, doing well  - up with PT/OT, encourage ambulation - Percocet for pain, Robaxin for muscle spasms - likely d/c home today with f/u in 2 weeks

## 2022-08-20 NOTE — Evaluation (Signed)
Occupational Therapy Evaluation Patient Details Name: Laurie Mcclain MRN: [id number] DOB: 04-10-1961 Today's Date: 08/20/2022   History of Present Illness 61 yo s/p L4/5 decompression and fusion. PMH: anxiety, lumbar laminectomy, depression, HTN   Clinical Impression   Completed education regarding compensatory strategies and use of DME and AE for LB ADL; pt verbalized understanding. Back precautions handout reviewed. No further OT needed however pt anxious about husband not being present for education. If husband/pt feels like they would like precautions reviewed regarding ADL tasks, please message me or call [phone]. thanks     Recommendations for follow up therapy are one component of a multi-disciplinary discharge planning process, led by the attending physician.  Recommendations may be updated based on patient status, additional functional criteria and insurance authorization.   Follow Up Recommendations  No OT follow up    Assistance Recommended at Discharge Intermittent Supervision/Assistance  Patient can return home with the following A little help with bathing/dressing/bathroom;Assist for transportation    Functional Status Assessment  Patient has had a recent decline in their functional status and demonstrates the ability to make significant improvements in function in a reasonable and predictable amount of time.  Equipment Recommendations  Other (comment) (RW)    Recommendations for Other Services       Precautions / Restrictions Precautions Precautions: Back Required Braces or Orthoses: Spinal Brace Spinal Brace: Applied in sitting position;Thoracolumbosacral orthotic (on when OOB) Restrictions Weight Bearing Restrictions: Yes      Mobility Bed Mobility Overal bed mobility: Needs Assistance Bed Mobility: Sidelying to Sit   Sidelying to sit: Supervision       General bed mobility comments: vc for log rolling adn technique; no physical assist     Transfers Overall transfer level: Needs assistance Equipment used: Rolling walker (2 wheels) Transfers: Sit to/from Stand Sit to Stand: Supervision                  Balance Overall balance assessment: Needs assistance   Sitting balance-Leahy Scale: Good       Standing balance-Leahy Scale: Fair                             ADL either performed or assessed with clinical judgement   ADL Overall ADL's : Needs assistance/impaired                                     Functional mobility during ADLs: Supervision/safety General ADL Comments: Educated on compensatory strategies for LB ADL with DME and AE; able to achieve figure four positioning; would benefit form toilet tong & long handled sponge     Vision Baseline Vision/History: 1 Wears glasses       Perception     Praxis      Pertinent Vitals/Pain Pain Assessment Pain Assessment: 0-10 Pain Score: 1  Pain Location: back Pain Descriptors / Indicators: Aching, Discomfort     Hand Dominance     Extremity/Trunk Assessment Upper Extremity Assessment Upper Extremity Assessment: Overall WFL for tasks assessed   Lower Extremity Assessment Lower Extremity Assessment: Defer to PT evaluation   Cervical / Trunk Assessment Cervical / Trunk Assessment: Back Surgery   Communication Communication Communication: No difficulties   Cognition Arousal/Alertness: Awake/alert Behavior During Therapy: WFL for tasks assessed/performed Overall Cognitive Status: Within Functional Limits for tasks assessed  General Comments       Exercises     Shoulder Instructions      Home Living Family/patient expects to be discharged to:: Private residence Living Arrangements: Spouse/significant other Available Help at Discharge: Available 24 hours/day Type of Home: House Home Access: Stairs to enter [employer] of Steps: 6 Entrance  Stairs-Rails: Right;Left Home Layout: Two level;Bed/bath upstairs Alternate Level Stairs-Number of Steps: flight Alternate Level Stairs-Rails: Right Bathroom Shower/Tub: Tub/shower unit;Curtain   Bathroom Toilet: Standard Bathroom Accessibility: Yes How Accessible: Accessible via walker Home Equipment: Standard Walker;Rollator (4 wheels);Shower seat          Prior Functioning/Environment Prior Level of Function : Independent/Modified Independent;Driving;Working/employed;Needs assist       Physical Assist : Mobility (physical);ADLs (physical) Mobility (physical): Gait;Transfers;Stairs ADLs (physical): Bathing;Dressing Mobility Comments: husband would assist wtih walking and steps dueto leg weakness adn figue          OT Problem List: Decreased knowledge of precautions;Decreased knowledge of use of DME or AE;Pain      OT Treatment/Interventions:      OT Goals(Current goals can be found in the care plan section) Acute Rehab OT Goals Patient Stated Goal: to be more active OT Goal Formulation: All assessment and education complete, DC therapy  OT Frequency:      Co-evaluation              AM-PAC OT "6 Clicks" Daily Activity     Outcome Measure Help from another person eating meals?: None Help from another person taking care of personal grooming?: None Help from another person toileting, which includes using toliet, bedpan, or urinal?: A Little Help from another person bathing (including washing, rinsing, drying)?: A Little Help from another person to put on and taking off regular upper body clothing?: None Help from another person to put on and taking off regular lower body clothing?: A Little 6 Click Score: 21   End of Session Equipment Utilized During Treatment: Rolling walker (2 wheels);Gait belt Nurse Communication: Mobility status  Activity Tolerance: Patient tolerated treatment well Patient left: in chair;with call bell/phone within reach  OT Visit  Diagnosis: Unsteadiness on feet (R26.81);Pain Pain - part of body:  (back)                Time: 0925-1005 OT Time Calculation (min): 40 min Charges:  OT General Charges $OT Visit: 1 Visit OT Evaluation $OT Eval Low Complexity: 1 Low OT Treatments $Self Care/Home Management : 8-22 mins  Luisa Dago, OT/L   Acute OT Clinical Specialist Acute Rehabilitation Services Pager [phone] Office [phone]   [facility] 08/20/2022, 11:04 AM

## 2022-08-20 NOTE — Evaluation (Signed)
Physical Therapy Evaluation Patient Details Name: Laurie Mcclain MRN: [id number] DOB: January 28, 1961 Today's Date: 08/20/2022  History of Present Illness  61 yo s/p L4/5 decompression and fusion. [facility]: anxiety, lumbar laminectomy, depression, HTN  Clinical Impression  Patient presents with mobility limited post surgery due to back precautions and brace and recent use of walker due to R LE pain.  Currently S for mobility with RW and min a on steps.  Spouse assisting her previously on steps and pt and spouse demo appropriate technique and safety on steps with cues.  She may benefit from referral later to follow up outpatient PT to address generalized weakness and for prevention of future back issues.  No current needs for PT follow up.  Spouse and pt demonstrating safe technique.  PT will sign off as likely for d/c home today.        Recommendations for follow up therapy are one component of a multi-disciplinary discharge planning process, led by the attending physician.  Recommendations may be updated based on patient status, additional functional criteria and insurance authorization.  Follow Up Recommendations No PT follow up      Assistance Recommended at Discharge Intermittent Supervision/Assistance  Patient can return home with the following  Help with stairs or ramp for entrance;Assist for transportation;Assistance with cooking/housework    Equipment Recommendations Rolling walker (2 wheels)  Recommendations for Other Services       Functional Status Assessment Patient has had a recent decline in their functional status and demonstrates the ability to make significant improvements in function in a reasonable and predictable amount of time.     Precautions / Restrictions Precautions Precautions: Back Required Braces or Orthoses: Spinal Brace Spinal Brace: Applied in sitting position;Thoracolumbosacral orthotic Restrictions Weight Bearing Restrictions: Yes      Mobility  Bed  Mobility               General bed mobility comments: up in chair post OT, discussed technique for bed mobility    Transfers Overall transfer level: Needs assistance Equipment used: Rolling walker (2 wheels) Transfers: Sit to/from Stand Sit to Stand: Supervision                Ambulation/Gait Ambulation/Gait assistance: Supervision Gait Distance (Feet): 150 Feet Assistive device: Rolling walker (2 wheels) Gait Pattern/deviations: Step-through pattern, Decreased stride length       General Gait Details: assist for safety and cues for turns with RW  Stairs Stairs: Yes Stairs assistance: Min assist Stair Management: One rail Right, Step to pattern, Forwards Number of Stairs: 10 General stair comments: spouse assisting on side without rail, was helping at home as well, cues for step to pattern and to favor R LE due to prior  sciatic pain  Wheelchair Mobility    Modified Rankin (Stroke Patients Only)       Balance Overall balance assessment: Needs assistance Sitting-balance support: Feet supported Sitting balance-Leahy Scale: Good       Standing balance-Leahy Scale: Fair Standing balance comment: can stand without UE support, used walker for ambulation                             Pertinent Vitals/Pain Pain Assessment Pain Score: 2  Pain Location: back Pain Descriptors / Indicators: Aching, Discomfort    Home Living Family/patient expects to be discharged to:: Private residence Living Arrangements: Spouse/significant other Available Help at Discharge: Available 24 hours/day Type of Home: House Home Access: Stairs to  enter Entrance Stairs-Rails: Right;Left Entrance Stairs-Number of Steps: 6 Alternate Level Stairs-Number of Steps: flight Home Layout: Two level;Bed/bath upstairs Home Equipment: Standard Walker;Rollator (4 wheels);Shower seat      Prior Function Prior Level of Function : Independent/Modified  Independent;Driving;Working/employed;Needs assist       Physical Assist : Mobility (physical);ADLs (physical) Mobility (physical): Gait;Transfers;Stairs ADLs (physical): Bathing;Dressing Mobility Comments: husband would assist wtih walking and steps due to leg weakness and figue       Hand Dominance        Extremity/Trunk Assessment   Upper Extremity Assessment Upper Extremity Assessment: Defer to OT evaluation    Lower Extremity Assessment Lower Extremity Assessment: Generalized weakness    Cervical / Trunk Assessment Cervical / Trunk Assessment: Back Surgery  Communication   Communication: No difficulties  Cognition Arousal/Alertness: Awake/alert Behavior During Therapy: WFL for tasks assessed/performed Overall Cognitive Status: Within Functional Limits for tasks assessed                                          General Comments      Exercises     Assessment/Plan    PT Assessment Patient does not need any further PT services  PT Problem List         PT Treatment Interventions      PT Goals (Current goals can be found in the Care Plan section)  Acute Rehab PT Goals PT Goal Formulation: All assessment and education complete, DC therapy    Frequency       Co-evaluation               AM-PAC PT "6 Clicks" Mobility  Outcome Measure Help needed turning from your back to your side while in a flat bed without using bedrails?: None Help needed moving from lying on your back to sitting on the side of a flat bed without using bedrails?: None Help needed moving to and from a bed to a chair (including a wheelchair)?: None Help needed standing up from a chair using your arms (e.g., wheelchair or bedside chair)?: None Help needed to walk in hospital room?: None Help needed climbing 3-5 steps with a railing? : A Little 6 Click Score: 23    End of Session Equipment Utilized During Treatment: Back brace Activity Tolerance: Patient  tolerated treatment well Patient left: in chair;with call bell/phone within reach;with family/visitor present   PT Visit Diagnosis: Other abnormalities of gait and mobility (R26.89)    Time: 1610-9604 PT Time Calculation (min) (ACUTE ONLY): 23 min   Charges:   PT Evaluation $PT Eval Low Complexity: 1 Low PT Treatments $Self Care/Home Management: 8-22        Magda Kiel, PT Acute Rehabilitation Services Office:[phone] 08/20/2022   Reginia Naas 08/20/2022, 11:13 AM

## 2022-08-20 NOTE — TOC Transition Note (Signed)
Transition of Care [facility]) - CM/SW Discharge Note   Patient Details  Name: ADRIEANA Mcclain MRN: [id number] Date of Birth: [birth year]  [facility]) CM/SW Contact:  Angelita Ingles, RN Phone Number:[phone]  08/20/2022, 11:50 AM   Clinical Narrative:    Patient discharging. Rolling walker has been ordered per [facility] care. DME to be delivered to bedside prior to d/c         Patient Goals and CMS Choice        Discharge Placement                       Discharge Plan and Services                                     Social Determinants of Health (SDOH) Interventions     Readmission Risk Interventions     No data to display

## 2022-08-25 ENCOUNTER — Encounter (HOSPITAL_COMMUNITY): Payer: Self-pay | Admitting: Orthopedic Surgery

## 2022-08-26 NOTE — Discharge Summary (Signed)
Patient ID: Laurie Mcclain MRN: [id number] DOB/AGE: Oct 27, 1961 61 y.o.  Admit date: 08/19/2022 Discharge date: 08/20/2022  Admission Diagnoses:  Principal Problem:   Radiculopathy   Discharge Diagnoses:  Same  Past Medical History:  Diagnosis Date   Anemia    hx with pregnancy   Anxiety    Asthma    hx as child   Degenerative disc disease    Depression    Headache    sinus headaches   Hypertension    SVT (supraventricular tachycardia)    Tinnitus     Surgeries: Procedure(s): RIGHT-SIDED LUMBAR 4 - LUMBAR 5 TRANSFORAMINAL LUMBAR INTERBODY FUSION AND DECOMPRESSION WITH INSTRUMENTATION AND ALLOGRAFT on 08/19/2022   Consultants: None  Discharged Condition: Improved  Hospital Course: Laurie Mcclain is an 61 y.o. female who was admitted 08/19/2022 for operative treatment of Radiculopathy. Patient has severe unremitting pain that affects sleep, daily activities, and work/hobbies. After pre-op clearance the patient was taken to the operating room on 08/19/2022 and underwent  Procedure(s): RIGHT-SIDED LUMBAR 4 - LUMBAR 5 TRANSFORAMINAL LUMBAR INTERBODY FUSION AND DECOMPRESSION WITH INSTRUMENTATION AND ALLOGRAFT.    Patient was given perioperative antibiotics:  Anti-infectives (From admission, onward)    Start     Dose/Rate Route Frequency Ordered Stop   08/19/22 2100  ceFAZolin (ANCEF) IVPB 2g/100 mL premix        2 g 200 mL/hr over 30 Minutes Intravenous Every 8 hours 08/19/22 1826 08/20/22 0500   08/19/22 1115  ceFAZolin (ANCEF) IVPB 2g/100 mL premix        2 g 200 mL/hr over 30 Minutes Intravenous On call to O.R. 08/19/22 1109 08/19/22 1321        Patient was given sequential compression devices, early ambulation to prevent DVT.  Patient benefited maximally from hospital stay and there were no complications.    Recent vital signs: BP (!) 154/74 (BP Location: Right Arm)   Pulse 68   Temp 99.2 F (37.3 C) (Oral)   Resp 16   Ht 5\' 4"  (1.626 m)   Wt 63.9 kg    SpO2 98%   BMI 24.17 kg/m    Discharge Medications:   Allergies as of 08/20/2022       Reactions   Erythromycin Other (See Comments)   GI- Upset   Other Other (See Comments)   Anti toxin tetanus Childhood allergy test result        Medication List     STOP taking these medications    HYDROcodone-acetaminophen 7.5-325 MG tablet Commonly known as: NORCO       TAKE these medications    ALPRAZolam 0.25 MG tablet Commonly known as: XANAX Take 0.25 mg by mouth at bedtime as needed for anxiety or sleep.   atenolol 25 MG tablet Commonly known as: TENORMIN Take 25 mg by mouth every evening.   CALCIUM + VITAMIN D3 PO Take 1 tablet by mouth daily. 1200 mg / 25 mg   cetirizine 10 MG tablet Commonly known as: ZYRTEC Take 10 mg by mouth daily as needed for allergies.   clobetasol ointment 0.05 % Commonly known as: TEMOVATE Apply 1 Application topically 2 (two) times daily.   estradiol 0.1 MG/GM vaginal cream Commonly known as: ESTRACE Place 1 Applicatorful vaginally 2 (two) times a week.   gabapentin 600 MG tablet Commonly known as: NEURONTIN Take 600 mg by mouth 3 (three) times daily.   Guaifenesin 1200 MG Tb12 Take 1,200 mg by mouth daily as needed for to loosen phlegm.  lidocaine 5 % Commonly known as: Lidoderm Place 1 patch onto the skin daily. Remove & Discard patch within 12 hours or as directed by MD   methocarbamol 500 MG tablet Commonly known as: ROBAXIN Take 1 tablet (500 mg total) by mouth every 6 (six) hours as needed for muscle spasms. What changed: when to take this   MULTI FOR HER 50+ PO Take 1 tablet by mouth at bedtime.   oxyCODONE-acetaminophen 5-325 MG tablet Commonly known as: PERCOCET/ROXICET Take 1-2 tablets by mouth every 4 (four) hours as needed for severe pain.   sertraline 50 MG tablet Commonly known as: ZOLOFT Take 1 tablet (50 mg total) by mouth daily. What changed: how much to take   SYSTANE OP Apply 1-2 drops to eye  daily as needed (dry eyes.).        Diagnostic Studies: DG Lumbar Spine 2-3 Views  Result Date: 08/19/2022 CLINICAL DATA:  Fluoroscopic assistance for lumbar spine surgery EXAM: LUMBAR SPINE - 2-3 VIEW COMPARISON:  MR lumbar spine done on 07/14/2022 FINDINGS: Fluoroscopic images show posterior surgical fusion at L4-L5 level. Intervertebral disc spacer is noted in place. Fluoroscopic time 48 seconds. Radiation dose 24.62 mGy. IMPRESSION: Fluoroscopic assistance was provided for surgical fusion at the L4-L5 level. Electronically Signed   By: Ernie Avena M.D.   On: 08/19/2022 16:47   DG C-Arm 1-60 Min-No Report  Result Date: 08/19/2022 Fluoroscopy was utilized by the requesting physician.  No radiographic interpretation.   DG C-Arm 1-60 Min-No Report  Result Date: 08/19/2022 Fluoroscopy was utilized by the requesting physician.  No radiographic interpretation.   DG C-Arm 1-60 Min-No Report  Result Date: 08/19/2022 Fluoroscopy was utilized by the requesting physician.  No radiographic interpretation.   DG Lumbar Spine 1 View  Result Date: 08/19/2022 CLINICAL DATA:  Intraoperative localization EXAM: LUMBAR SPINE - 1 VIEW COMPARISON:  07/14/2022 FINDINGS: Single cross-table lateral radiograph of the lumbar spine was obtained intraoperatively. There are 2 instruments projecting within the posterior soft tissues of the spine, the more superior is at the L3-4 level. The more inferiorly located instrument is positioned near the L5-S1 level. IMPRESSION: Intraoperative localization of the lumbar spine as above. Electronically Signed   By: Duanne Guess D.O.   On: 08/19/2022 14:47   DG Epidural/Nerve Root  Result Date: 08/02/2022 CLINICAL DATA:  Displacement of the L4-5 nerve root. Right L5 radiculopathy. FLUOROSCOPY: Radiation Exposure Index (as provided by the fluoroscopic device): Dose area product 28.63 PROCEDURE: SELECTIVE NERVE ROOT AND TRANSFORAMINAL EPIDURAL STEROID INJECTION: A  transforaminal approach was performed on the right at the L5-S1 foramen. The overlying skin was cleansed and anesthetized. A 22 gauge spinal needle was advanced into the foramen from a lateral oblique approach. Injection of 2cc of Isovue-M 200 outlined the nerve root and extended into the epidural space. No vascular or intrathecal spread is evident. I then injected 40 mg of Depo-Medrol and 1 ml of 1% lidocaine. The patient tolerated the procedure without evidence for significant complication. She experienced concordant pain. Pain is greatest with this level. A transforaminal approach was performed on the right at the L4-5 foramen. The overlying skin was cleansed and anesthetized. A 22 gauge spinal needle was advanced into the foramen from a lateral oblique approach. Injection of 2cc of Isovue-M 200 outlined the nerve root and extended into the epidural space. No vascular or intrathecal spread is evident. I then injected 40 mg of Depo-Medrol and 1 ml of 1% lidocaine. The patient tolerated the procedure without evidence for  complication. She experienced concordant pain. She dated experience some nausea with a vasovagal reaction. She was fully recovered before discharge. The patient was observed for 20 minutes prior to discharge in stable neurologic condition. IMPRESSION: Technically successful selective nerve root block and transforaminal epidural steroid injection on theright at the L4-5 and L5-S1 foramina. Electronically Signed   By: San Morelle M.D.   On: 08/02/2022 09:23    Disposition: Home   POD #1 s/p L4/5 decompression and fusion, doing well   - up with PT/OT, encourage ambulation - Percocet for pain, Robaxin for muscle spasms -Scripts for pain sent to pharmacy electronically  -D/C instructions sheet printed and in chart -D/C today  -F/U in office 2 weeks   Signed: Lennie Muckle Rucha Wissinger 08/26/2022, 12:52 PM

## 2022-11-19 DIAGNOSIS — M5451 Vertebrogenic low back pain: Secondary | ICD-10-CM | POA: Diagnosis not present

## 2022-11-19 DIAGNOSIS — Z4789 Encounter for other orthopedic aftercare: Secondary | ICD-10-CM | POA: Diagnosis not present

## 2022-11-19 DIAGNOSIS — R531 Weakness: Secondary | ICD-10-CM | POA: Diagnosis not present

## 2022-11-23 DIAGNOSIS — M5451 Vertebrogenic low back pain: Secondary | ICD-10-CM | POA: Diagnosis not present

## 2022-11-23 DIAGNOSIS — R531 Weakness: Secondary | ICD-10-CM | POA: Diagnosis not present

## 2022-11-23 DIAGNOSIS — Z4789 Encounter for other orthopedic aftercare: Secondary | ICD-10-CM | POA: Diagnosis not present

## 2022-11-25 DIAGNOSIS — Z4789 Encounter for other orthopedic aftercare: Secondary | ICD-10-CM | POA: Diagnosis not present

## 2022-11-25 DIAGNOSIS — M5451 Vertebrogenic low back pain: Secondary | ICD-10-CM | POA: Diagnosis not present

## 2022-11-25 DIAGNOSIS — R531 Weakness: Secondary | ICD-10-CM | POA: Diagnosis not present

## 2022-12-02 DIAGNOSIS — M5451 Vertebrogenic low back pain: Secondary | ICD-10-CM | POA: Diagnosis not present

## 2022-12-02 DIAGNOSIS — Z4789 Encounter for other orthopedic aftercare: Secondary | ICD-10-CM | POA: Diagnosis not present

## 2022-12-02 DIAGNOSIS — R531 Weakness: Secondary | ICD-10-CM | POA: Diagnosis not present

## 2022-12-06 DIAGNOSIS — R531 Weakness: Secondary | ICD-10-CM | POA: Diagnosis not present

## 2022-12-06 DIAGNOSIS — Z4789 Encounter for other orthopedic aftercare: Secondary | ICD-10-CM | POA: Diagnosis not present

## 2022-12-06 DIAGNOSIS — M5451 Vertebrogenic low back pain: Secondary | ICD-10-CM | POA: Diagnosis not present

## 2022-12-07 ENCOUNTER — Encounter (HOSPITAL_COMMUNITY): Payer: Self-pay | Admitting: Orthopedic Surgery

## 2022-12-09 DIAGNOSIS — Z4789 Encounter for other orthopedic aftercare: Secondary | ICD-10-CM | POA: Diagnosis not present

## 2022-12-09 DIAGNOSIS — R531 Weakness: Secondary | ICD-10-CM | POA: Diagnosis not present

## 2022-12-09 DIAGNOSIS — M5451 Vertebrogenic low back pain: Secondary | ICD-10-CM | POA: Diagnosis not present

## 2022-12-13 DIAGNOSIS — M5451 Vertebrogenic low back pain: Secondary | ICD-10-CM | POA: Diagnosis not present

## 2022-12-13 DIAGNOSIS — Z4789 Encounter for other orthopedic aftercare: Secondary | ICD-10-CM | POA: Diagnosis not present

## 2022-12-13 DIAGNOSIS — R531 Weakness: Secondary | ICD-10-CM | POA: Diagnosis not present

## 2022-12-15 DIAGNOSIS — R531 Weakness: Secondary | ICD-10-CM | POA: Diagnosis not present

## 2022-12-15 DIAGNOSIS — Z4789 Encounter for other orthopedic aftercare: Secondary | ICD-10-CM | POA: Diagnosis not present

## 2022-12-15 DIAGNOSIS — M5451 Vertebrogenic low back pain: Secondary | ICD-10-CM | POA: Diagnosis not present

## 2022-12-20 DIAGNOSIS — R531 Weakness: Secondary | ICD-10-CM | POA: Diagnosis not present

## 2022-12-20 DIAGNOSIS — M5451 Vertebrogenic low back pain: Secondary | ICD-10-CM | POA: Diagnosis not present

## 2022-12-20 DIAGNOSIS — Z4789 Encounter for other orthopedic aftercare: Secondary | ICD-10-CM | POA: Diagnosis not present

## 2022-12-22 DIAGNOSIS — R531 Weakness: Secondary | ICD-10-CM | POA: Diagnosis not present

## 2022-12-22 DIAGNOSIS — M5451 Vertebrogenic low back pain: Secondary | ICD-10-CM | POA: Diagnosis not present

## 2022-12-22 DIAGNOSIS — Z4789 Encounter for other orthopedic aftercare: Secondary | ICD-10-CM | POA: Diagnosis not present

## 2023-01-05 DIAGNOSIS — R531 Weakness: Secondary | ICD-10-CM | POA: Diagnosis not present

## 2023-01-05 DIAGNOSIS — Z4789 Encounter for other orthopedic aftercare: Secondary | ICD-10-CM | POA: Diagnosis not present

## 2023-01-05 DIAGNOSIS — M5451 Vertebrogenic low back pain: Secondary | ICD-10-CM | POA: Diagnosis not present

## 2023-01-10 DIAGNOSIS — R531 Weakness: Secondary | ICD-10-CM | POA: Diagnosis not present

## 2023-01-10 DIAGNOSIS — M5451 Vertebrogenic low back pain: Secondary | ICD-10-CM | POA: Diagnosis not present

## 2023-01-10 DIAGNOSIS — Z4789 Encounter for other orthopedic aftercare: Secondary | ICD-10-CM | POA: Diagnosis not present

## 2023-01-13 DIAGNOSIS — M5451 Vertebrogenic low back pain: Secondary | ICD-10-CM | POA: Diagnosis not present

## 2023-01-13 DIAGNOSIS — R531 Weakness: Secondary | ICD-10-CM | POA: Diagnosis not present

## 2023-01-13 DIAGNOSIS — Z4789 Encounter for other orthopedic aftercare: Secondary | ICD-10-CM | POA: Diagnosis not present

## 2023-01-26 DIAGNOSIS — L900 Lichen sclerosus et atrophicus: Secondary | ICD-10-CM | POA: Diagnosis not present

## 2023-02-04 DIAGNOSIS — M545 Low back pain, unspecified: Secondary | ICD-10-CM | POA: Diagnosis not present

## 2023-02-15 DIAGNOSIS — R7303 Prediabetes: Secondary | ICD-10-CM | POA: Diagnosis not present

## 2023-02-15 DIAGNOSIS — F419 Anxiety disorder, unspecified: Secondary | ICD-10-CM | POA: Diagnosis not present

## 2023-02-15 DIAGNOSIS — E785 Hyperlipidemia, unspecified: Secondary | ICD-10-CM | POA: Diagnosis not present

## 2023-02-15 DIAGNOSIS — I10 Essential (primary) hypertension: Secondary | ICD-10-CM | POA: Diagnosis not present

## 2023-04-12 DIAGNOSIS — Z1231 Encounter for screening mammogram for malignant neoplasm of breast: Secondary | ICD-10-CM | POA: Diagnosis not present

## 2023-04-12 DIAGNOSIS — Z6826 Body mass index (BMI) 26.0-26.9, adult: Secondary | ICD-10-CM | POA: Diagnosis not present

## 2023-04-12 DIAGNOSIS — Z01419 Encounter for gynecological examination (general) (routine) without abnormal findings: Secondary | ICD-10-CM | POA: Diagnosis not present

## 2023-05-17 DIAGNOSIS — F419 Anxiety disorder, unspecified: Secondary | ICD-10-CM | POA: Diagnosis not present

## 2023-05-17 DIAGNOSIS — I10 Essential (primary) hypertension: Secondary | ICD-10-CM | POA: Diagnosis not present

## 2023-05-17 DIAGNOSIS — R7303 Prediabetes: Secondary | ICD-10-CM | POA: Diagnosis not present

## 2023-08-09 DIAGNOSIS — H31093 Other chorioretinal scars, bilateral: Secondary | ICD-10-CM | POA: Diagnosis not present

## 2023-08-09 DIAGNOSIS — H25813 Combined forms of age-related cataract, bilateral: Secondary | ICD-10-CM | POA: Diagnosis not present

## 2023-08-09 DIAGNOSIS — E119 Type 2 diabetes mellitus without complications: Secondary | ICD-10-CM | POA: Diagnosis not present

## 2023-08-09 DIAGNOSIS — H40013 Open angle with borderline findings, low risk, bilateral: Secondary | ICD-10-CM | POA: Diagnosis not present

## 2023-09-22 DIAGNOSIS — Z0000 Encounter for general adult medical examination without abnormal findings: Secondary | ICD-10-CM | POA: Diagnosis not present

## 2023-09-22 DIAGNOSIS — I10 Essential (primary) hypertension: Secondary | ICD-10-CM | POA: Diagnosis not present

## 2023-09-22 DIAGNOSIS — E785 Hyperlipidemia, unspecified: Secondary | ICD-10-CM | POA: Diagnosis not present

## 2023-09-22 DIAGNOSIS — R7303 Prediabetes: Secondary | ICD-10-CM | POA: Diagnosis not present

## 2023-10-19 DIAGNOSIS — R52 Pain, unspecified: Secondary | ICD-10-CM | POA: Diagnosis not present

## 2023-10-19 DIAGNOSIS — J069 Acute upper respiratory infection, unspecified: Secondary | ICD-10-CM | POA: Diagnosis not present

## 2023-10-19 DIAGNOSIS — R059 Cough, unspecified: Secondary | ICD-10-CM | POA: Diagnosis not present

## 2024-02-14 DIAGNOSIS — H40013 Open angle with borderline findings, low risk, bilateral: Secondary | ICD-10-CM | POA: Diagnosis not present

## 2024-04-20 DIAGNOSIS — Z01419 Encounter for gynecological examination (general) (routine) without abnormal findings: Secondary | ICD-10-CM | POA: Diagnosis not present

## 2024-06-12 DIAGNOSIS — Z1231 Encounter for screening mammogram for malignant neoplasm of breast: Secondary | ICD-10-CM | POA: Diagnosis not present

## 2024-08-16 DIAGNOSIS — H40013 Open angle with borderline findings, low risk, bilateral: Secondary | ICD-10-CM | POA: Diagnosis not present

## 2024-08-16 DIAGNOSIS — E119 Type 2 diabetes mellitus without complications: Secondary | ICD-10-CM | POA: Diagnosis not present

## 2024-08-16 DIAGNOSIS — H25813 Combined forms of age-related cataract, bilateral: Secondary | ICD-10-CM | POA: Diagnosis not present

## 2024-08-16 DIAGNOSIS — H31093 Other chorioretinal scars, bilateral: Secondary | ICD-10-CM | POA: Diagnosis not present

## 2024-12-21 NOTE — Progress Notes (Signed)
 Laurie Mcclain                                          MRN: [id number]   12/21/2024   The [facility] Quality Team Specialist reviewed this patient medical record for the purposes of chart review for care gap closure. The following were reviewed: chart review for care gap closure-kidney health evaluation for diabetes:eGFR  and uACR.    [facility] Quality Team
